# Patient Record
Sex: Female | Born: 1965 | Race: Asian | Hispanic: No | Marital: Married | Smoking: Never smoker
Health system: Southern US, Community
[De-identification: ages and names within clinical notes are randomized; demographics above are authoritative.]

## PROBLEM LIST (undated history)

## (undated) DIAGNOSIS — E559 Vitamin D deficiency, unspecified: Secondary | ICD-10-CM

## (undated) DIAGNOSIS — E785 Hyperlipidemia, unspecified: Secondary | ICD-10-CM

## (undated) HISTORY — DX: Hyperlipidemia, unspecified: E78.5

## (undated) HISTORY — PX: NECK SURGERY: SHX720

## (undated) HISTORY — DX: Vitamin D deficiency, unspecified: E55.9

---

## 2002-05-08 ENCOUNTER — Encounter: Payer: Self-pay | Admitting: Neurosurgery

## 2002-05-08 ENCOUNTER — Ambulatory Visit (HOSPITAL_COMMUNITY): Admission: RE | Admit: 2002-05-08 | Discharge: 2002-05-08 | Payer: Self-pay | Admitting: Neurosurgery

## 2008-06-09 ENCOUNTER — Ambulatory Visit: Payer: Self-pay | Admitting: Internal Medicine

## 2009-06-22 ENCOUNTER — Ambulatory Visit: Payer: Self-pay | Admitting: Internal Medicine

## 2009-09-08 ENCOUNTER — Ambulatory Visit: Payer: Self-pay | Admitting: Family Medicine

## 2010-07-06 ENCOUNTER — Ambulatory Visit: Payer: Self-pay | Admitting: Internal Medicine

## 2012-06-19 ENCOUNTER — Ambulatory Visit: Payer: Self-pay | Admitting: Internal Medicine

## 2014-03-05 ENCOUNTER — Ambulatory Visit: Payer: Self-pay | Admitting: Gastroenterology

## 2014-03-08 LAB — PATHOLOGY REPORT

## 2014-08-05 ENCOUNTER — Ambulatory Visit: Payer: Self-pay | Admitting: Physician Assistant

## 2014-09-03 ENCOUNTER — Ambulatory Visit: Payer: Self-pay | Admitting: Gastroenterology

## 2015-07-11 ENCOUNTER — Other Ambulatory Visit: Payer: Self-pay | Admitting: Physician Assistant

## 2015-07-11 DIAGNOSIS — E2839 Other primary ovarian failure: Secondary | ICD-10-CM

## 2015-07-11 DIAGNOSIS — Z1231 Encounter for screening mammogram for malignant neoplasm of breast: Secondary | ICD-10-CM

## 2015-08-08 ENCOUNTER — Ambulatory Visit
Admission: RE | Admit: 2015-08-08 | Discharge: 2015-08-08 | Disposition: A | Discharge status: Home / Self Care | Payer: 59 | Source: Ambulatory Visit | Attending: Physician Assistant | Admitting: Physician Assistant

## 2015-08-08 ENCOUNTER — Other Ambulatory Visit: Payer: Self-pay

## 2015-08-08 DIAGNOSIS — Z1231 Encounter for screening mammogram for malignant neoplasm of breast: Secondary | ICD-10-CM

## 2016-07-12 ENCOUNTER — Other Ambulatory Visit: Payer: Self-pay | Admitting: Physician Assistant

## 2016-07-12 DIAGNOSIS — Z1231 Encounter for screening mammogram for malignant neoplasm of breast: Secondary | ICD-10-CM

## 2016-07-16 ENCOUNTER — Other Ambulatory Visit: Payer: Self-pay | Admitting: Physician Assistant

## 2016-07-16 DIAGNOSIS — R1310 Dysphagia, unspecified: Secondary | ICD-10-CM

## 2016-07-31 ENCOUNTER — Other Ambulatory Visit: Payer: 59

## 2016-07-31 ENCOUNTER — Ambulatory Visit: Payer: 59

## 2016-08-10 ENCOUNTER — Ambulatory Visit
Admission: RE | Admit: 2016-08-10 | Discharge: 2016-08-10 | Disposition: A | Discharge status: Home / Self Care | Payer: 59 | Source: Ambulatory Visit | Attending: Physician Assistant | Admitting: Physician Assistant

## 2016-08-10 ENCOUNTER — Other Ambulatory Visit: Payer: Self-pay | Admitting: Physician Assistant

## 2016-08-10 DIAGNOSIS — Z1231 Encounter for screening mammogram for malignant neoplasm of breast: Secondary | ICD-10-CM

## 2016-12-06 ENCOUNTER — Ambulatory Visit: Payer: 59 | Admitting: Podiatry

## 2017-08-01 ENCOUNTER — Other Ambulatory Visit: Payer: Self-pay | Admitting: Nurse Practitioner

## 2017-08-01 DIAGNOSIS — Z1231 Encounter for screening mammogram for malignant neoplasm of breast: Secondary | ICD-10-CM

## 2017-08-23 ENCOUNTER — Ambulatory Visit
Admission: RE | Admit: 2017-08-23 | Discharge: 2017-08-23 | Disposition: A | Discharge status: Home / Self Care | Payer: 59 | Source: Ambulatory Visit | Attending: Nurse Practitioner | Admitting: Nurse Practitioner

## 2017-08-23 DIAGNOSIS — Z1231 Encounter for screening mammogram for malignant neoplasm of breast: Secondary | ICD-10-CM | POA: Diagnosis not present

## 2017-12-24 ENCOUNTER — Other Ambulatory Visit: Payer: Self-pay | Admitting: Family Medicine

## 2018-01-03 LAB — VITAMIN D 25 HYDROXY (VIT D DEFICIENCY, FRACTURES): Vit D, 25-Hydroxy: 17.5 ng/mL — ABNORMAL LOW (ref 30.0–100.0)

## 2018-01-03 LAB — COMPREHENSIVE METABOLIC PANEL
ALBUMIN: 4.4 g/dL (ref 3.5–5.5)
ALK PHOS: 70 IU/L (ref 39–117)
ALT: 18 IU/L (ref 0–32)
AST: 16 IU/L (ref 0–40)
Albumin/Globulin Ratio: 1.5 (ref 1.2–2.2)
BUN/Creatinine Ratio: 17 (ref 9–23)
BUN: 12 mg/dL (ref 6–24)
Bilirubin Total: 0.6 mg/dL (ref 0.0–1.2)
CO2: 20 mmol/L (ref 20–29)
CREATININE: 0.7 mg/dL (ref 0.57–1.00)
Calcium: 9.5 mg/dL (ref 8.7–10.2)
Chloride: 104 mmol/L (ref 96–106)
GFR calc Af Amer: 114 mL/min/{1.73_m2} (ref >59)
GFR, EST NON AFRICAN AMERICAN: 99 mL/min/{1.73_m2} (ref >59)
GLUCOSE: 89 mg/dL (ref 65–99)
Globulin, Total: 3 g/dL (ref 1.5–4.5)
Potassium: 4.2 mmol/L (ref 3.5–5.2)
Sodium: 139 mmol/L (ref 134–144)
Total Protein: 7.4 g/dL (ref 6.0–8.5)

## 2018-01-03 LAB — CBC WITH DIFFERENTIAL/PLATELET
BASOS ABS: 0 10*3/uL (ref 0.0–0.2)
Basos: 0 %
EOS (ABSOLUTE): 0.1 10*3/uL (ref 0.0–0.4)
EOS: 3 %
HEMATOCRIT: 37.7 % (ref 34.0–46.6)
HEMOGLOBIN: 12.6 g/dL (ref 11.1–15.9)
IMMATURE GRANULOCYTES: 0 %
Immature Grans (Abs): 0 10*3/uL (ref 0.0–0.1)
LYMPHS ABS: 1.6 10*3/uL (ref 0.7–3.1)
Lymphs: 40 %
MCH: 28.8 pg (ref 26.6–33.0)
MCHC: 33.4 g/dL (ref 31.5–35.7)
MCV: 86 fL (ref 79–97)
MONOCYTES: 9 %
Monocytes Absolute: 0.3 10*3/uL (ref 0.1–0.9)
NEUTROS PCT: 48 %
Neutrophils Absolute: 2 10*3/uL (ref 1.4–7.0)
Platelets: 244 10*3/uL (ref 150–379)
RBC: 4.38 x10E6/uL (ref 3.77–5.28)
RDW: 13.3 % (ref 12.3–15.4)
WBC: 4 10*3/uL (ref 3.4–10.8)

## 2018-01-03 LAB — LIPID PANEL WITH LDL/HDL RATIO
CHOLESTEROL TOTAL: 194 mg/dL (ref 100–199)
HDL: 55 mg/dL (ref >39)
LDL CALC: 107 mg/dL — AB (ref 0–99)
LDL/HDL RATIO: 1.9 ratio (ref 0.0–3.2)
Triglycerides: 160 mg/dL — ABNORMAL HIGH (ref 0–149)
VLDL CHOLESTEROL CAL: 32 mg/dL (ref 5–40)

## 2018-01-03 LAB — B12 AND FOLATE PANEL
Folate: >20 ng/mL (ref >3.0)
Vitamin B-12: 815 pg/mL (ref 232–1245)

## 2018-01-03 LAB — T4, FREE: Free T4: 1.03 ng/dL (ref 0.82–1.77)

## 2018-01-03 LAB — TSH: TSH: 2.37 u[IU]/mL (ref 0.450–4.500)

## 2018-01-14 ENCOUNTER — Telehealth: Payer: Self-pay

## 2018-01-14 ENCOUNTER — Telehealth: Payer: Self-pay | Admitting: Internal Medicine

## 2018-01-14 NOTE — Telephone Encounter (Signed)
Pt advised for labs is ok and vitamin d is low take otc as per dr Welton Flakeskhan

## 2018-01-14 NOTE — Telephone Encounter (Signed)
-----   [facility] sent at 01/14/2018 10:13 AM EST ----- PT WOULD LIKE HER LAB RESULTS PER PT HAS LEFT MANY MESSAGES TO GET RESULTS ( I DO NOT SEE ANY PREVIOUS MESSAGE TAKEN, ) PER PT NEEDS RESULTS OR CALLBACK OR SHE WILL SWITCH PCP

## 2018-01-14 NOTE — Telephone Encounter (Signed)
SPOKE WITH PT REGARDING LAB RESULTS AND SENT PROVIDER MESSAGE FOR LAB RESULTS TO ADVISE PT, AWAITING ON PROVIDER RESPONSE/BR

## 2018-03-30 ENCOUNTER — Emergency Department: Payer: Managed Care, Other (non HMO)

## 2018-03-30 ENCOUNTER — Other Ambulatory Visit: Payer: Self-pay

## 2018-03-30 ENCOUNTER — Encounter: Payer: Self-pay | Admitting: Emergency Medicine

## 2018-03-30 ENCOUNTER — Emergency Department
Admission: EM | Admit: 2018-03-30 | Discharge: 2018-03-30 | Disposition: A | Discharge status: Home / Self Care | Payer: Managed Care, Other (non HMO) | Attending: Student in an Organized Health Care Education/Training Program | Admitting: Student in an Organized Health Care Education/Training Program

## 2018-03-30 DIAGNOSIS — R1031 Right lower quadrant pain: Secondary | ICD-10-CM | POA: Diagnosis present

## 2018-03-30 DIAGNOSIS — R109 Unspecified abdominal pain: Secondary | ICD-10-CM

## 2018-03-30 LAB — BASIC METABOLIC PANEL
ANION GAP: 7 (ref 5–15)
BUN: 15 mg/dL (ref 6–20)
CALCIUM: 9.1 mg/dL (ref 8.9–10.3)
CO2: 24 mmol/L (ref 22–32)
Chloride: 107 mmol/L (ref 101–111)
Creatinine, Ser: 0.83 mg/dL (ref 0.44–1.00)
GLUCOSE: 147 mg/dL — AB (ref 65–99)
POTASSIUM: 4.1 mmol/L (ref 3.5–5.1)
Sodium: 138 mmol/L (ref 135–145)

## 2018-03-30 LAB — CBC
HEMATOCRIT: 40.3 % (ref 35.0–47.0)
HEMOGLOBIN: 13.4 g/dL (ref 12.0–16.0)
MCH: 28.1 pg (ref 26.0–34.0)
MCHC: 33.3 g/dL (ref 32.0–36.0)
MCV: 84.5 fL (ref 80.0–100.0)
Platelets: 253 10*3/uL (ref 150–440)
RBC: 4.78 MIL/uL (ref 3.80–5.20)
RDW: 13.4 % (ref 11.5–14.5)
WBC: 11.2 10*3/uL — ABNORMAL HIGH (ref 3.6–11.0)

## 2018-03-30 LAB — URINALYSIS, ROUTINE W REFLEX MICROSCOPIC
BACTERIA UA: NONE SEEN
BILIRUBIN URINE: NEGATIVE
Glucose, UA: NEGATIVE mg/dL
KETONES UR: 20 mg/dL — AB
Leukocytes, UA: NEGATIVE
Nitrite: NEGATIVE
PROTEIN: NEGATIVE mg/dL
Specific Gravity, Urine: 1.018 (ref 1.005–1.030)
pH: 8 (ref 5.0–8.0)

## 2018-03-30 MED ORDER — SODIUM CHLORIDE 0.9 % IV BOLUS
1000.0000 mL | Freq: Once | INTRAVENOUS | Status: AC
  Administered 2018-03-30: 1000 mL via INTRAVENOUS

## 2018-03-30 MED ORDER — CEPHALEXIN 500 MG PO CAPS
500.0000 mg | ORAL_CAPSULE | Freq: Once | ORAL | Status: AC
  Administered 2018-03-30: 500 mg via ORAL
  Filled 2018-03-30: qty 1

## 2018-03-30 MED ORDER — KETOROLAC TROMETHAMINE 30 MG/ML IJ SOLN
15.0000 mg | Freq: Once | INTRAMUSCULAR | Status: AC
  Administered 2018-03-30: 15 mg via INTRAVENOUS
  Filled 2018-03-30: qty 1

## 2018-03-30 MED ORDER — FENTANYL CITRATE (PF) 100 MCG/2ML IJ SOLN
50.0000 ug | Freq: Once | INTRAMUSCULAR | Status: AC
  Administered 2018-03-30: 50 ug via INTRAVENOUS
  Filled 2018-03-30: qty 2

## 2018-03-30 MED ORDER — IOPAMIDOL (ISOVUE-370) INJECTION 76%
75.0000 mL | Freq: Once | INTRAVENOUS | Status: AC | PRN
  Administered 2018-03-30: 75 mL via INTRAVENOUS

## 2018-03-30 MED ORDER — FENTANYL CITRATE (PF) 100 MCG/2ML IJ SOLN
100.0000 ug | INTRAMUSCULAR | Status: DC | PRN
  Filled 2018-03-30: qty 2

## 2018-03-30 MED ORDER — CEPHALEXIN 500 MG PO CAPS: 500.0000 mg | ORAL_CAPSULE | Freq: Three times a day (TID) | ORAL | 0 refills | Status: AC

## 2018-03-30 NOTE — ED Provider Notes (Signed)
[facility] Emergency Department Provider Note    First MD Initiated Contact with Patient 03/30/18 1032     (approximate)  I have reviewed the triage vital signs and the nursing notes.   HISTORY  Chief Complaint Flank Pain and Back Pain (right lower )    HPI Linda Silva is a 52 y.o. female who presents with moderate to severe rlq and right flank pain with dysuria that started this am.  Patient states that she has a remote history of kidney stone.  No previous abdominal surgeries.  No measured fevers.  Was seen in urgent care today and had urine dipstick that had suggestive of urinary tract infection she was sent to the ER for further evaluation of UTI and possible kidney infection.  History reviewed. No pertinent past medical history. Family History  Problem Relation Age of Onset  . Breast cancer Neg Hx    Past Surgical History:  Procedure Laterality Date  . NECK SURGERY     There are no active problems to display for this patient.     Prior to Admission medications   Not on File    Allergies Patient has no known allergies.    Social History Social History   Tobacco Use  . Smoking status: Never Smoker  Substance Use Topics  . Alcohol use: Never    Frequency: Never  . Drug use: Never    Review of Systems Patient denies headaches, rhinorrhea, blurry vision, numbness, shortness of breath, chest pain, edema, cough, abdominal pain, nausea, vomiting, diarrhea, dysuria, fevers, rashes or hallucinations unless otherwise stated above in HPI. ____________________________________________   PHYSICAL EXAM:  VITAL SIGNS: Vitals:   03/30/18 0935  BP: (!) 169/87  Pulse: 62  Resp: 16  Temp: (!) 97.5 F (36.4 C)  SpO2: 97%    Constitutional: Alert and oriented. Well appearing and in no acute distress. Eyes: Conjunctivae are normal.  Head: Atraumatic. Nose: No congestion/rhinnorhea. Mouth/Throat: Mucous membranes are moist.     Neck: No stridor. Painless ROM.  Cardiovascular: Normal rate, regular rhythm. Grossly normal heart sounds.  Good peripheral circulation. Respiratory: Normal respiratory effort.  No retractions. Lungs CTAB. Gastrointestinal: Soft with RLQ, ttp. No distention. No abdominal bruits. + right CVA tenderness. Genitourinary:  Musculoskeletal: No lower extremity tenderness nor edema.  No joint effusions. Neurologic:  Normal speech and language. No gross focal neurologic deficits are appreciated. No facial droop Skin:  Skin is warm, dry and intact. No rash noted. Psychiatric: Mood and affect are normal. Speech and behavior are normal.  ____________________________________________   LABS (all labs ordered are listed, but only abnormal results are displayed)  Results for orders placed or performed during the hospital encounter of 03/30/18 (from the past 24 hour(s))  Basic metabolic panel     Status: Abnormal   Collection Time: 03/30/18  9:50 AM  Result Value Ref Range   Sodium 138 135 - 145 mmol/L   Potassium 4.1 3.5 - 5.1 mmol/L   Chloride 107 101 - 111 mmol/L   CO2 24 22 - 32 mmol/L   Glucose, Bld 147 (H) 65 - 99 mg/dL   BUN 15 6 - 20 mg/dL   Creatinine, Ser 1.61 0.44 - 1.00 mg/dL   Calcium 9.1 8.9 - 09.6 mg/dL   GFR calc non Af Amer >60 >60 mL/min   GFR calc Af Amer >60 >60 mL/min   Anion gap 7 5 - 15  CBC     Status: Abnormal   Collection Time:  03/30/18  9:50 AM  Result Value Ref Range   WBC 11.2 (H) 3.6 - 11.0 K/uL   RBC 4.78 3.80 - 5.20 MIL/uL   Hemoglobin 13.4 12.0 - 16.0 g/dL   HCT 16.1 09.6 - 04.5 %   MCV 84.5 80.0 - 100.0 fL   MCH 28.1 26.0 - 34.0 pg   MCHC 33.3 32.0 - 36.0 g/dL   RDW 40.9 81.1 - 91.4 %   Platelets 253 150 - 440 K/uL   ____________________________________________ ____________________________________________  RADIOLOGY  I personally reviewed all radiographic images ordered to evaluate for the above acute complaints and reviewed radiology reports and  findings.  These findings were personally discussed with the patient.  Please see medical record for radiology report.  ____________________________________________   PROCEDURES  Procedure(s) performed:  Procedures    Critical Care performed: no ____________________________________________   INITIAL IMPRESSION / ASSESSMENT AND PLAN / ED COURSE  Pertinent labs & imaging results that were available during my care of the patient were reviewed by me and considered in my medical decision making (see chart for details).  DDX: Bursitis, Pilo, UTI, stone, colitis, muscular skeletal pain, shingles  Linda Silva is a 52 y.o. who presents to the ED with symptoms as described above.  Patient with mild right lower quadrant pain.  Blood work sent for the above differential does show evidence of leukocytosis.  No evidence of UTI.  Possible stone but based on her tenderness will order CT imaging to evaluate for acute appendicitis.  Clinical Course as of Mar 30 1406  Sun Mar 30, 2018  1355 She reassessed.  States that she is feeling much improved.  CT imaging shows no evidence of acute appendicitis.  Probable recently passed kidney stone.  Her urine here shows no evidence of bacteria or WBCs but her urine at urgent care did show leukocytes.  Based on her pain and differential including ascending urinary tract infection we will go ahead empirically treat and send urine for culture.  Patient was able to tolerate PO and was able to ambulate with a steady gait.  Have discussed with the patient and available family all diagnostics and treatments performed thus far and all questions were answered to the best of my ability. The patient demonstrates understanding and agreement with plan.    [PR]    Clinical Course User Index [PR] Willy Eddy, MD     As part of my medical decision making, I reviewed the following data within the electronic MEDICAL RECORD NUMBER Nursing notes reviewed and incorporated,  Labs reviewed, notes from prior ED visits.   ____________________________________________   FINAL CLINICAL IMPRESSION(S) / ED DIAGNOSES  Final diagnoses:  Right flank pain      NEW MEDICATIONS STARTED DURING THIS VISIT:  New Prescriptions   No medications on file     Note:  This document was prepared using Dragon voice recognition software and may include unintentional dictation errors.    Willy Eddy, MD 03/30/18 [phone]

## 2018-03-30 NOTE — ED Notes (Signed)
Patient transported to CT 

## 2018-03-30 NOTE — Discharge Instructions (Addendum)

## 2018-03-30 NOTE — ED Triage Notes (Addendum)
FIRST NURSE NOTE-sent from urgent care for possible kidney infection. No labs done at urgent care, urine dip report sent.  NAD at this time.  Alert. Pulled next for triage to evaluate.

## 2018-03-30 NOTE — ED Triage Notes (Signed)
Pt reports woke up this morning with severe right lower back pain, flank pain reports burning sensation with urination and pressure. Reports took a Norco at home did not help with pain.

## 2018-07-07 ENCOUNTER — Ambulatory Visit: Payer: Managed Care, Other (non HMO) | Admitting: Nurse Practitioner

## 2018-07-07 ENCOUNTER — Encounter: Payer: Self-pay | Admitting: Nurse Practitioner

## 2018-07-07 VITALS — BP 129/78 | HR 79 | Resp 16 | Ht 62.0 in | Wt 141.6 lb

## 2018-07-07 DIAGNOSIS — Z124 Encounter for screening for malignant neoplasm of cervix: Secondary | ICD-10-CM

## 2018-07-07 DIAGNOSIS — E782 Mixed hyperlipidemia: Secondary | ICD-10-CM

## 2018-07-07 DIAGNOSIS — E559 Vitamin D deficiency, unspecified: Secondary | ICD-10-CM

## 2018-07-07 DIAGNOSIS — M25561 Pain in right knee: Secondary | ICD-10-CM

## 2018-07-07 DIAGNOSIS — R300 Dysuria: Secondary | ICD-10-CM

## 2018-07-07 DIAGNOSIS — G8929 Other chronic pain: Secondary | ICD-10-CM

## 2018-07-07 DIAGNOSIS — R079 Chest pain, unspecified: Secondary | ICD-10-CM | POA: Diagnosis not present

## 2018-07-07 DIAGNOSIS — Z0001 Encounter for general adult medical examination with abnormal findings: Secondary | ICD-10-CM | POA: Diagnosis not present

## 2018-07-07 DIAGNOSIS — M25571 Pain in right ankle and joints of right foot: Secondary | ICD-10-CM

## 2018-07-07 MED ORDER — ROSUVASTATIN CALCIUM 5 MG PO TABS: 5.0000 mg | ORAL_TABLET | Freq: Every day | ORAL | 3 refills | Status: DC

## 2018-07-07 MED ORDER — MELOXICAM 7.5 MG PO TABS: 7.5000 mg | ORAL_TABLET | Freq: Two times a day (BID) | ORAL | 0 refills | Status: DC | PRN

## 2018-07-07 MED ORDER — ERGOCALCIFEROL 1.25 MG (50000 UT) PO CAPS: 50000.0000 [IU] | ORAL_CAPSULE | ORAL | 5 refills | Status: DC

## 2018-07-07 MED ORDER — ROSUVASTATIN CALCIUM 5 MG PO TABS: 5.0000 mg | ORAL_TABLET | Freq: Every day | ORAL | 4 refills | Status: DC

## 2018-07-07 NOTE — Progress Notes (Signed)
[facility] [address] [place], [place] [address]  Internal MEDICINE  Office Visit Note  Patient Name: Linda Silva  [id number] 01, 2067  [id number]  Date of Service: 07/20/2018   Pt is here for routine health maintenance examination  Chief Complaint  Patient presents with  . Annual Exam  . Gynecologic Exam  . Pain    knee and ankle pain been going on for some years but has gotten worse lately  . Labs Only    pt would like to know results for labs from May      The patient is c/o pain in right ankle and right knee. Has been going on for some time . Pain is worse when she is standing or walking for long periods of time. There is some welling on both the knee and the ankle. No specific injury to report.     Current Medication: Outpatient Encounter Medications as of 07/07/2018  Medication Sig  . ergocalciferol (DRISDOL) 50000 units capsule Take 1 capsule (50,000 Units total) by mouth once a week.  . meloxicam (MOBIC) 7.5 MG tablet Take 1 tablet (7.5 mg total) by mouth 2 (two) times daily as needed for pain.  . rosuvastatin (CRESTOR) 5 MG tablet Take 1 tablet (5 mg total) by mouth daily.  . [DISCONTINUED] rosuvastatin (CRESTOR) 5 MG tablet Take 1 tablet (5 mg total) by mouth daily.  . [DISCONTINUED] rosuvastatin (CRESTOR) 5 MG tablet Take 1 tablet (5 mg total) by mouth daily.   No facility-administered encounter medications on file as of 07/07/2018.     Surgical History: Past Surgical History:  Procedure Laterality Date  . NECK SURGERY      Medical History: History reviewed. No pertinent past medical history.  Family History: Family History  Problem Relation Age of Onset  . Breast cancer Neg Hx       Review of Systems  Constitutional: Negative for activity change, chills, fatigue and unexpected weight change.  HENT: Negative for congestion, postnasal drip, rhinorrhea, sneezing and sore throat.   Eyes: Negative.  Negative for redness.  Respiratory:  Negative for cough, chest tightness, shortness of breath and wheezing.   Cardiovascular: Negative for chest pain and palpitations.  Gastrointestinal: Negative for abdominal pain, constipation, diarrhea, nausea and vomiting.  Endocrine: Negative for cold intolerance, heat intolerance, polydipsia, polyphagia and polyuria.  Genitourinary: Negative.  Negative for dysuria and frequency.  Musculoskeletal: Positive for arthralgias and myalgias. Negative for back pain, joint swelling and neck pain.       Tenderness and mild swelling of right knee and right ankle.   Skin: Negative for rash.  Allergic/Immunologic: Negative for environmental allergies.  Neurological: Negative for dizziness, tremors, numbness and headaches.  Hematological: Negative for adenopathy. Does not bruise/bleed easily.  Psychiatric/Behavioral: Negative for behavioral problems (Depression), sleep disturbance and suicidal ideas. The patient is not nervous/anxious.     Today's Vitals   07/07/18 1510  BP: 129/78  Pulse: 79  Resp: 16  SpO2: 99%  Weight: 141 lb 9.6 oz (64.2 kg)  Height: 5\' 2"  (1.575 m)   Physical Exam  Constitutional: She is oriented to person, place, and time. She appears well-developed and well-nourished. No distress.  HENT:  Head: Normocephalic and atraumatic.  Nose: Nose normal.  Mouth/Throat: Oropharynx is clear and moist. No oropharyngeal exudate.  Eyes: Pupils are equal, round, and reactive to light. Conjunctivae and EOM are normal.  Neck: Normal range of motion. Neck supple. No JVD present. Carotid bruit is not present. No tracheal deviation  present. No thyromegaly present.  Cardiovascular: Normal rate, regular rhythm, normal heart sounds and intact distal pulses. Exam reveals no gallop and no friction rub.  No murmur heard. Pulmonary/Chest: Effort normal and breath sounds normal. No respiratory distress. She has no wheezes. She has no rales. She exhibits no tenderness. Right breast exhibits no  inverted nipple, no mass, no nipple discharge, no skin change and no tenderness. Left breast exhibits no inverted nipple, no mass, no nipple discharge, no skin change and no tenderness.  Abdominal: Soft. Bowel sounds are normal.  Genitourinary: Vagina normal and uterus normal.  Genitourinary Comments: No tenderness, masses, or organomeglay present during bimanual exam .  Musculoskeletal: Normal range of motion.  Mild swelling and tenderness of right knee. Crepitus can be felt with flexion of the knee. ROM and strength are intact.  Tenderness with mild swelling along the lateral aspect of right ankle. Hurts more with internal and external rotation of the foot. Distal pulse is intact.   Lymphadenopathy:    She has no cervical adenopathy.  Neurological: She is alert and oriented to person, place, and time. No cranial nerve deficit.  Skin: Skin is warm and dry. Capillary refill takes less than 2 seconds. She is not diaphoretic.  Psychiatric: She has a normal mood and affect. Her behavior is normal. Judgment and thought content normal.  Nursing note and vitals reviewed.   Assessment/Plan: 1. Encounter for general adult medical examination with abnormal findings Annual health maintenance exam today.  2. Chest pain, unspecified type Will get echo for further evaluation.  - ECHOCARDIOGRAM COMPLETE; Future  3. Chronic pain of right ankle Start meloxicam 7.5mg  twice daily to reduce pain and inflammation. Rest, ice, and elevate the ankle when possible. Will get x-ray for further evaluation.   - DG Ankle Complete Right; Future - meloxicam (MOBIC) 7.5 MG tablet; Take 1 tablet (7.5 mg total) by mouth 2 (two) times daily as needed for pain.  Dispense: 30 tablet; Refill: 0  4. Chronic pain of right knee Start meloxicam 7.5mg  twice daily to reduce pain and inflammation. Rest, ice, and elevate the knee when possible. Will get x-ray for further evaluation.   - DG Knee Complete 4 Views Right; Future -  meloxicam (MOBIC) 7.5 MG tablet; Take 1 tablet (7.5 mg total) by mouth 2 (two) times daily as needed for pain.  Dispense: 30 tablet; Refill: 0  5. Mixed hyperlipidemia - rosuvastatin (CRESTOR) 5 MG tablet; Take 1 tablet (5 mg total) by mouth daily.  Dispense: 90 tablet; Refill: 4  6. Vitamin D deficiency - ergocalciferol (DRISDOL) 50000 units capsule; Take 1 capsule (50,000 Units total) by mouth once a week.  Dispense: 4 capsule; Refill: 5  7. Screening for malignant neoplasm of cervix - Pap IG and HPV (high risk) DNA detection    General Counseling: Linda Silva verbalizes understanding of the findings of todays visit and agrees with plan of treatment. I have discussed any further diagnostic evaluation that may be needed or ordered today. We also reviewed her medications today. she has been encouraged to call the office with any questions or concerns that should arise related to todays visit.    Counseling:  Apply a compressive ACE bandage. Rest and elevate the affected painful area.  Apply cold compresses intermittently as needed.  As pain recedes, begin normal activities slowly as tolerated.  Call if symptoms persist.  This patient was seen by Vincent GrosHeather Tabby Beaston FNP Collaboration with Dr Lyndon CodeFozia M Khan as a part of collaborative care agreement  Orders Placed This Encounter  Procedures  . Microscopic Examination  . DG Knee Complete 4 Views Right  . DG Ankle Complete Right  . UA/M w/rflx Culture, Routine  . ECHOCARDIOGRAM COMPLETE    Meds ordered this encounter  Medications  . DISCONTD: rosuvastatin (CRESTOR) 5 MG tablet    Sig: Take 1 tablet (5 mg total) by mouth daily.    Dispense:  90 tablet    Refill:  3    Order Specific Question:   Supervising Provider    Answer:   Lyndon Code [[id number]]  . DISCONTD: rosuvastatin (CRESTOR) 5 MG tablet    Sig: Take 1 tablet (5 mg total) by mouth daily.    Dispense:  90 tablet    Refill:  4    Order Specific Question:   Supervising Provider     Answer:   Lyndon Code [[id number]]  . ergocalciferol (DRISDOL) 50000 units capsule    Sig: Take 1 capsule (50,000 Units total) by mouth once a week.    Dispense:  4 capsule    Refill:  5    Order Specific Question:   Supervising Provider    Answer:   Lyndon Code [[id number]]  . rosuvastatin (CRESTOR) 5 MG tablet    Sig: Take 1 tablet (5 mg total) by mouth daily.    Dispense:  90 tablet    Refill:  4    Order Specific Question:   Supervising Provider    Answer:   Lyndon Code [[id number]]  . meloxicam (MOBIC) 7.5 MG tablet    Sig: Take 1 tablet (7.5 mg total) by mouth 2 (two) times daily as needed for pain.    Dispense:  30 tablet    Refill:  0    Order Specific Question:   Supervising Provider    Answer:   Lyndon Code [[id number]]    Time spent: 39 Minutes      Lyndon Code, MD  Internal Medicine

## 2018-07-08 LAB — MICROSCOPIC EXAMINATION
Bacteria, UA: NONE SEEN
Casts: NONE SEEN /lpf

## 2018-07-08 LAB — UA/M W/RFLX CULTURE, ROUTINE
Bilirubin, UA: NEGATIVE
Glucose, UA: NEGATIVE
Ketones, UA: NEGATIVE
LEUKOCYTES UA: NEGATIVE
Nitrite, UA: NEGATIVE
PH UA: 6 (ref 5.0–7.5)
PROTEIN UA: NEGATIVE
RBC, UA: NEGATIVE
SPEC GRAV UA: 1.005 (ref 1.005–1.030)
Urobilinogen, Ur: 0.2 mg/dL (ref 0.2–1.0)

## 2018-07-10 LAB — PAP IG AND HPV HIGH-RISK
HPV, HIGH-RISK: NEGATIVE
PAP Smear Comment: 0

## 2018-07-13 NOTE — Progress Notes (Signed)
Please let the patient know that er pap smear was normal. Thanks

## 2018-07-14 ENCOUNTER — Telehealth: Payer: Self-pay

## 2018-07-14 NOTE — Telephone Encounter (Signed)
PT WAS NOTIFIED. 

## 2018-07-14 NOTE — Telephone Encounter (Signed)
-----   Message from Carlean JewsHeather E Boscia, NP sent at 07/13/2018  4:42 PM EDT ----- Please let the patient know that er pap smear was normal. Thanks

## 2018-07-18 ENCOUNTER — Ambulatory Visit: Payer: Managed Care, Other (non HMO)

## 2018-07-18 ENCOUNTER — Other Ambulatory Visit: Payer: Self-pay

## 2018-07-18 DIAGNOSIS — R079 Chest pain, unspecified: Secondary | ICD-10-CM | POA: Diagnosis not present

## 2018-07-20 DIAGNOSIS — Z0001 Encounter for general adult medical examination with abnormal findings: Principal | ICD-10-CM

## 2018-07-20 DIAGNOSIS — G8929 Other chronic pain: Secondary | ICD-10-CM | POA: Insufficient documentation

## 2018-07-20 DIAGNOSIS — M25571 Pain in right ankle and joints of right foot: Secondary | ICD-10-CM

## 2018-07-20 DIAGNOSIS — M25561 Pain in right knee: Secondary | ICD-10-CM

## 2018-07-20 DIAGNOSIS — Z124 Encounter for screening for malignant neoplasm of cervix: Secondary | ICD-10-CM | POA: Insufficient documentation

## 2018-07-20 DIAGNOSIS — R079 Chest pain, unspecified: Secondary | ICD-10-CM | POA: Insufficient documentation

## 2018-07-20 DIAGNOSIS — Z1239 Encounter for other screening for malignant neoplasm of breast: Secondary | ICD-10-CM | POA: Insufficient documentation

## 2018-07-20 DIAGNOSIS — E782 Mixed hyperlipidemia: Secondary | ICD-10-CM | POA: Insufficient documentation

## 2018-07-20 DIAGNOSIS — R300 Dysuria: Secondary | ICD-10-CM | POA: Insufficient documentation

## 2018-07-20 DIAGNOSIS — E559 Vitamin D deficiency, unspecified: Secondary | ICD-10-CM | POA: Insufficient documentation

## 2018-07-30 ENCOUNTER — Telehealth: Payer: Self-pay

## 2018-07-30 NOTE — Telephone Encounter (Signed)
Pt advised echo normal and copy of result for pickup

## 2018-07-30 NOTE — Telephone Encounter (Signed)
Overall her echo looks good.

## 2018-11-07 ENCOUNTER — Ambulatory Visit: Payer: Self-pay | Admitting: Nurse Practitioner

## 2018-12-23 ENCOUNTER — Other Ambulatory Visit: Payer: Self-pay | Admitting: Nurse Practitioner

## 2018-12-24 LAB — COMPREHENSIVE METABOLIC PANEL
A/G RATIO: 2 (ref 1.2–2.2)
ALT: 23 IU/L (ref 0–32)
AST: 22 IU/L (ref 0–40)
Albumin: 4.7 g/dL (ref 3.8–4.9)
Alkaline Phosphatase: 65 IU/L (ref 39–117)
BUN/Creatinine Ratio: 16 (ref 9–23)
BUN: 12 mg/dL (ref 6–24)
Bilirubin Total: 0.7 mg/dL (ref 0.0–1.2)
CALCIUM: 9.5 mg/dL (ref 8.7–10.2)
CHLORIDE: 102 mmol/L (ref 96–106)
CO2: 22 mmol/L (ref 20–29)
Creatinine, Ser: 0.77 mg/dL (ref 0.57–1.00)
GFR calc Af Amer: 101 mL/min/{1.73_m2} (ref >59)
GFR, EST NON AFRICAN AMERICAN: 88 mL/min/{1.73_m2} (ref >59)
Globulin, Total: 2.4 g/dL (ref 1.5–4.5)
Glucose: 85 mg/dL (ref 65–99)
POTASSIUM: 4 mmol/L (ref 3.5–5.2)
Sodium: 139 mmol/L (ref 134–144)
Total Protein: 7.1 g/dL (ref 6.0–8.5)

## 2018-12-24 LAB — CBC
HEMOGLOBIN: 13 g/dL (ref 11.1–15.9)
Hematocrit: 38.9 % (ref 34.0–46.6)
MCH: 28.7 pg (ref 26.6–33.0)
MCHC: 33.4 g/dL (ref 31.5–35.7)
MCV: 86 fL (ref 79–97)
Platelets: 243 10*3/uL (ref 150–450)
RBC: 4.53 x10E6/uL (ref 3.77–5.28)
RDW: 12.9 % (ref 11.7–15.4)
WBC: 4.8 10*3/uL (ref 3.4–10.8)

## 2018-12-24 LAB — B12 AND FOLATE PANEL: VITAMIN B 12: 811 pg/mL (ref 232–1245)

## 2018-12-24 LAB — LIPID PANEL W/O CHOL/HDL RATIO
Cholesterol, Total: 179 mg/dL (ref 100–199)
HDL: 56 mg/dL (ref >39)
LDL Calculated: 89 mg/dL (ref 0–99)
TRIGLYCERIDES: 170 mg/dL — AB (ref 0–149)
VLDL CHOLESTEROL CAL: 34 mg/dL (ref 5–40)

## 2018-12-24 LAB — IRON AND TIBC
Iron Saturation: 26 % (ref 15–55)
Iron: 104 ug/dL (ref 27–159)
Total Iron Binding Capacity: 401 ug/dL (ref 250–450)
UIBC: 297 ug/dL (ref 131–425)

## 2018-12-24 LAB — TSH: TSH: 3.04 u[IU]/mL (ref 0.450–4.500)

## 2018-12-24 LAB — VITAMIN D 25 HYDROXY (VIT D DEFICIENCY, FRACTURES): VIT D 25 HYDROXY: 36.6 ng/mL (ref 30.0–100.0)

## 2018-12-24 LAB — FERRITIN: Ferritin: 49 ng/mL (ref 15–150)

## 2018-12-24 LAB — T4, FREE: Free T4: 0.97 ng/dL (ref 0.82–1.77)

## 2019-01-06 ENCOUNTER — Other Ambulatory Visit: Payer: Self-pay

## 2019-01-07 ENCOUNTER — Other Ambulatory Visit: Payer: Self-pay | Admitting: Nurse Practitioner

## 2019-01-07 DIAGNOSIS — E559 Vitamin D deficiency, unspecified: Secondary | ICD-10-CM

## 2019-01-07 MED ORDER — ERGOCALCIFEROL 1.25 MG (50000 UT) PO CAPS: 50000.0000 [IU] | ORAL_CAPSULE | ORAL | 5 refills | Status: DC

## 2019-01-07 NOTE — Progress Notes (Signed)
Vitamin d just above normal levels at check 11/2018. Renew drisdol 50000iu weekly and sent new rx to [facility] per request.

## 2019-05-28 ENCOUNTER — Other Ambulatory Visit: Payer: Self-pay | Admitting: Internal Medicine

## 2019-07-06 ENCOUNTER — Other Ambulatory Visit: Payer: Self-pay | Admitting: Nurse Practitioner

## 2019-07-07 LAB — COMPREHENSIVE METABOLIC PANEL
ALT: 13 IU/L (ref 0–32)
AST: 17 IU/L (ref 0–40)
Albumin/Globulin Ratio: 1.8 (ref 1.2–2.2)
Albumin: 4.4 g/dL (ref 3.8–4.9)
Alkaline Phosphatase: 62 IU/L (ref 39–117)
BUN/Creatinine Ratio: 15 (ref 9–23)
BUN: 11 mg/dL (ref 6–24)
Bilirubin Total: 0.7 mg/dL (ref 0.0–1.2)
CO2: 23 mmol/L (ref 20–29)
Calcium: 9.6 mg/dL (ref 8.7–10.2)
Chloride: 104 mmol/L (ref 96–106)
Creatinine, Ser: 0.73 mg/dL (ref 0.57–1.00)
GFR calc Af Amer: 107 mL/min/{1.73_m2} (ref >59)
GFR calc non Af Amer: 93 mL/min/{1.73_m2} (ref >59)
Globulin, Total: 2.5 g/dL (ref 1.5–4.5)
Glucose: 89 mg/dL (ref 65–99)
Potassium: 4.1 mmol/L (ref 3.5–5.2)
Sodium: 139 mmol/L (ref 134–144)
Total Protein: 6.9 g/dL (ref 6.0–8.5)

## 2019-07-07 LAB — CBC
Hematocrit: 38.2 % (ref 34.0–46.6)
Hemoglobin: 12.3 g/dL (ref 11.1–15.9)
MCH: 27.6 pg (ref 26.6–33.0)
MCHC: 32.2 g/dL (ref 31.5–35.7)
MCV: 86 fL (ref 79–97)
Platelets: 224 10*3/uL (ref 150–450)
RBC: 4.46 x10E6/uL (ref 3.77–5.28)
RDW: 13.1 % (ref 11.7–15.4)
WBC: 4.3 10*3/uL (ref 3.4–10.8)

## 2019-07-07 LAB — LIPID PANEL W/O CHOL/HDL RATIO
Cholesterol, Total: 191 mg/dL (ref 100–199)
HDL: 54 mg/dL (ref >39)
LDL Calculated: 108 mg/dL — ABNORMAL HIGH (ref 0–99)
Triglycerides: 145 mg/dL (ref 0–149)
VLDL Cholesterol Cal: 29 mg/dL (ref 5–40)

## 2019-07-07 LAB — VITAMIN D 25 HYDROXY (VIT D DEFICIENCY, FRACTURES): Vit D, 25-Hydroxy: 36.8 ng/mL (ref 30.0–100.0)

## 2019-07-07 LAB — IRON AND TIBC
Iron Saturation: 27 % (ref 15–55)
Iron: 105 ug/dL (ref 27–159)
Total Iron Binding Capacity: 392 ug/dL (ref 250–450)
UIBC: 287 ug/dL (ref 131–425)

## 2019-07-07 LAB — B12 AND FOLATE PANEL
Folate: >20 ng/mL (ref >3.0)
Vitamin B-12: 721 pg/mL (ref 232–1245)

## 2019-07-07 LAB — TSH: TSH: 1.8 u[IU]/mL (ref 0.450–4.500)

## 2019-07-07 LAB — FERRITIN: Ferritin: 33 ng/mL (ref 15–150)

## 2019-07-07 LAB — T4, FREE: Free T4: 0.88 ng/dL (ref 0.82–1.77)

## 2019-07-08 NOTE — Progress Notes (Signed)
Labs good. Discuss at visit 07/09/2019

## 2019-07-09 ENCOUNTER — Ambulatory Visit (INDEPENDENT_AMBULATORY_CARE_PROVIDER_SITE_OTHER): Payer: Managed Care, Other (non HMO) | Admitting: Nurse Practitioner

## 2019-07-09 ENCOUNTER — Other Ambulatory Visit: Payer: Self-pay

## 2019-07-09 ENCOUNTER — Encounter: Payer: Self-pay | Admitting: Nurse Practitioner

## 2019-07-09 VITALS — BP 119/87 | HR 74 | Temp 97.8°F | Resp 16 | Ht 62.0 in | Wt 140.0 lb

## 2019-07-09 DIAGNOSIS — R300 Dysuria: Secondary | ICD-10-CM

## 2019-07-09 DIAGNOSIS — Z0001 Encounter for general adult medical examination with abnormal findings: Secondary | ICD-10-CM

## 2019-07-09 DIAGNOSIS — Z1239 Encounter for other screening for malignant neoplasm of breast: Secondary | ICD-10-CM

## 2019-07-09 DIAGNOSIS — E782 Mixed hyperlipidemia: Secondary | ICD-10-CM | POA: Diagnosis not present

## 2019-07-09 DIAGNOSIS — G8929 Other chronic pain: Secondary | ICD-10-CM

## 2019-07-09 DIAGNOSIS — M25561 Pain in right knee: Secondary | ICD-10-CM | POA: Diagnosis not present

## 2019-07-09 MED ORDER — ROSUVASTATIN CALCIUM 5 MG PO TABS: 5.0000 mg | ORAL_TABLET | Freq: Every day | ORAL | 4 refills | Status: DC

## 2019-07-09 NOTE — Progress Notes (Signed)
[facility] [address] [place], [place] [id number]  Internal MEDICINE  Office Visit Note  Patient Name: Linda Silva  [id number]  [id number]  Date of Service: 07/09/2019   Pt is here for routine health maintenance examination   Chief Complaint  Patient presents with  . Annual Exam  . Hyperlipidemia  . Vitamin D deficiency  . Quality Metric Gaps    colonoscopy, mammogram  . Medication Refill    crestor and vitamin d to [facility]   . Results    labs     The patient is here for routine health maintenance exam. She has recently had labs done. LDL cholesterol mildly elevated at 108. Rest of lipid panel was within normal limits. All other labs were normal. She state that she has some intermittent right knee and ankle pain. She has seen orthopedics for this. Standing for long periods makes this worse. Taking NSAIDs and resting improves the problem.    Current Medication: Outpatient Encounter Medications as of 07/09/2019  Medication Sig  . ergocalciferol (DRISDOL) 1.25 MG (50000 UT) capsule Take 1 capsule (50,000 Units total) by mouth once a week.  . rosuvastatin (CRESTOR) 5 MG tablet Take 1 tablet (5 mg total) by mouth daily.  . [DISCONTINUED] rosuvastatin (CRESTOR) 5 MG tablet Take 1 tablet (5 mg total) by mouth daily.  . [DISCONTINUED] meloxicam (MOBIC) 7.5 MG tablet Take 1 tablet (7.5 mg total) by mouth 2 (two) times daily as needed for pain. (Patient not taking: Reported on 07/09/2019)   No facility-administered encounter medications on file as of 07/09/2019.     Surgical History: Past Surgical History:  Procedure Laterality Date  . NECK SURGERY      Medical History: History reviewed. No pertinent past medical history.  Family History: Family History  Problem Relation Age of Onset  . Breast cancer Neg Hx       Review of Systems  Constitutional: Negative for activity change, chills, fatigue and unexpected weight change.  HENT: Negative for  congestion, postnasal drip, rhinorrhea, sneezing and sore throat.   Respiratory: Negative for cough, chest tightness, shortness of breath and wheezing.   Cardiovascular: Negative for chest pain and palpitations.  Gastrointestinal: Negative for abdominal pain, constipation, diarrhea, nausea and vomiting.  Endocrine: Negative for cold intolerance, heat intolerance, polydipsia and polyuria.  Genitourinary: Negative for dysuria and frequency.  Musculoskeletal: Positive for arthralgias and myalgias. Negative for back pain, joint swelling and neck pain.       Tenderness of right knee and right ankle.   Skin: Negative for rash.  Allergic/Immunologic: Negative for environmental allergies.  Neurological: Negative for dizziness, tremors, numbness and headaches.  Hematological: Negative for adenopathy. Does not bruise/bleed easily.  Psychiatric/Behavioral: Negative for behavioral problems (Depression), sleep disturbance and suicidal ideas. The patient is not nervous/anxious.    Today's Vitals   07/09/19 0857  BP: 119/87  Pulse: 74  Resp: 16  Temp: 97.8 F (36.6 C)  SpO2: 97%  Weight: 140 lb (63.5 kg)  Height: 5\' 2"  (1.575 m)   Body mass index is 25.61 kg/m.  Physical Exam Vitals signs and nursing note reviewed.  Constitutional:      General: She is not in acute distress.    Appearance: Normal appearance. She is well-developed. She is not diaphoretic.  HENT:     Head: Normocephalic and atraumatic.     Mouth/Throat:     Pharynx: No oropharyngeal exudate.  Eyes:     Pupils: Pupils are equal, round, and reactive to  light.  Neck:     Musculoskeletal: Normal range of motion and neck supple.     Thyroid: No thyromegaly.     Vascular: No carotid bruit or JVD.     Trachea: No tracheal deviation.  Cardiovascular:     Rate and Rhythm: Normal rate and regular rhythm.     Pulses: Normal pulses.     Heart sounds: Normal heart sounds. No murmur. No friction rub. No gallop.   Pulmonary:      Effort: Pulmonary effort is normal. No respiratory distress.     Breath sounds: Normal breath sounds. No wheezing or rales.  Chest:     Chest wall: No tenderness.     Breasts:        Right: Normal. No swelling, bleeding, inverted nipple, mass, nipple discharge, skin change or tenderness.        Left: Normal. No swelling, bleeding, inverted nipple, mass, nipple discharge, skin change or tenderness.  Abdominal:     General: Bowel sounds are normal.     Palpations: Abdomen is soft.  Musculoskeletal: Normal range of motion.  Lymphadenopathy:     Cervical: No cervical adenopathy.     Upper Body:     Right upper body: No supraclavicular or axillary adenopathy.     Left upper body: No supraclavicular or axillary adenopathy.  Skin:    General: Skin is warm and dry.  Neurological:     Mental Status: She is alert and oriented to person, place, and time.     Cranial Nerves: No cranial nerve deficit.  Psychiatric:        Behavior: Behavior normal.        Thought Content: Thought content normal.        Judgment: Judgment normal.      LABS: Recent Results (from the past 2160 hour(s))  Comprehensive metabolic panel     Status: None   Collection Time: 07/06/19  9:38 AM  Result Value Ref Range   Glucose 89 65 - 99 mg/dL   BUN 11 6 - 24 mg/dL   Creatinine, Ser 7.820.73 0.57 - 1.00 mg/dL   GFR calc non Af Amer 93 >59 mL/min/1.73   GFR calc Af Amer 107 >59 mL/min/1.73   BUN/Creatinine Ratio 15 9 - 23   Sodium 139 134 - 144 mmol/L   Potassium 4.1 3.5 - 5.2 mmol/L   Chloride 104 96 - 106 mmol/L   CO2 23 20 - 29 mmol/L   Calcium 9.6 8.7 - 10.2 mg/dL   Total Protein 6.9 6.0 - 8.5 g/dL   Albumin 4.4 3.8 - 4.9 g/dL   Globulin, Total 2.5 1.5 - 4.5 g/dL   Albumin/Globulin Ratio 1.8 1.2 - 2.2   Bilirubin Total 0.7 0.0 - 1.2 mg/dL   Alkaline Phosphatase 62 39 - 117 IU/L   AST 17 0 - 40 IU/L   ALT 13 0 - 32 IU/L  CBC     Status: None   Collection Time: 07/06/19  9:38 AM  Result Value Ref Range    WBC 4.3 3.4 - 10.8 x10E3/uL   RBC 4.46 3.77 - 5.28 x10E6/uL   Hemoglobin 12.3 11.1 - 15.9 g/dL   Hematocrit 95.638.2 21.334.0 - 46.6 %   MCV 86 79 - 97 fL   MCH 27.6 26.6 - 33.0 pg   MCHC 32.2 31.5 - 35.7 g/dL   RDW 08.613.1 57.811.7 - 46.915.4 %   Platelets 224 150 - 450 x10E3/uL  Lipid Panel w/o Chol/HDL Ratio  Status: Abnormal   Collection Time: 07/06/19  9:38 AM  Result Value Ref Range   Cholesterol, Total 191 100 - 199 mg/dL   Triglycerides 161145 0 - 149 mg/dL   HDL 54 >09>39 mg/dL   VLDL Cholesterol Cal 29 5 - 40 mg/dL   LDL Calculated 604108 (H) 0 - 99 mg/dL  Iron and TIBC     Status: None   Collection Time: 07/06/19  9:38 AM  Result Value Ref Range   Total Iron Binding Capacity 392 250 - 450 ug/dL   UIBC 540287 981131 - 191425 ug/dL   Iron 478105 27 - 295159 ug/dL   Iron Saturation 27 15 - 55 %  B12 and Folate Panel     Status: None   Collection Time: 07/06/19  9:38 AM  Result Value Ref Range   Vitamin B-12 721 232 - 1,245 pg/mL   Folate >20.0 >3.0 ng/mL    Comment: A serum folate concentration of less than 3.1 ng/mL is considered to represent clinical deficiency.   T4, free     Status: None   Collection Time: 07/06/19  9:38 AM  Result Value Ref Range   Free T4 0.88 0.82 - 1.77 ng/dL  TSH     Status: None   Collection Time: 07/06/19  9:38 AM  Result Value Ref Range   TSH 1.800 0.450 - 4.500 uIU/mL  VITAMIN D 25 Hydroxy (Vit-D Deficiency, Fractures)     Status: None   Collection Time: 07/06/19  9:38 AM  Result Value Ref Range   Vit D, 25-Hydroxy 36.8 30.0 - 100.0 ng/mL    Comment: Vitamin D deficiency has been defined by the Institute of Medicine and an Endocrine Society practice guideline as a level of serum 25-OH vitamin D less than 20 ng/mL (1,2). The Endocrine Society went on to further define vitamin D insufficiency as a level between 21 and 29 ng/mL (2). 1. IOM (Institute of Medicine). 2010. Dietary reference    intakes for calcium and D. [place]: The    [employer]. 2.  Holick MF, [place], Bischoff-Ferrari HA, et al.    Evaluation, treatment, and prevention of vitamin D    deficiency: an Endocrine Society clinical practice    guideline. JCEM. 2011 Jul; 96(7):1911-30.   Ferritin     Status: None   Collection Time: 07/06/19  9:38 AM  Result Value Ref Range   Ferritin 33 15 - 150 ng/mL   Assessment/Plan:  1. Encounter for general adult medical examination with abnormal findings Annual health maintenance exam today  2. Mixed hyperlipidemia Lipid panel stable. Continue crestor as prescribed  - rosuvastatin (CRESTOR) 5 MG tablet; Take 1 tablet (5 mg total) by mouth daily.  Dispense: 90 tablet; Refill: 4  3. Chronic pain of right knee Apply a compressive ACE bandage. Rest and elevate the affected painful area.  Apply cold compresses intermittently as needed.  As pain recedes, begin normal activities slowly as tolerated.  Recommend orthopedic follow up as needed  4. Screening for breast cancer - MM DIGITAL SCREENING BILATERAL; Future  5. Dysuria - Urinalysis, Routine w reflex microscopic   General Counseling: Anjelina verbalizes understanding of the findings of todays visit and agrees with plan of treatment. I have discussed any further diagnostic evaluation that may be needed or ordered today. We also reviewed her medications today. she has been encouraged to call the office with any questions or concerns that should arise related to todays visit.    Counseling:  This  patient was seen by Leretha Pol FNP Collaboration with Dr Lavera Guise as a part of collaborative care agreement  Orders Placed This Encounter  Procedures  . MM DIGITAL SCREENING BILATERAL  . Urinalysis, Routine w reflex microscopic    Meds ordered this encounter  Medications  . rosuvastatin (CRESTOR) 5 MG tablet    Sig: Take 1 tablet (5 mg total) by mouth daily.    Dispense:  90 tablet    Refill:  4    Order Specific Question:   Supervising Provider    Answer:   Lavera Guise [[id number]]    Time spent: [place], MD  Internal Medicine

## 2019-07-10 LAB — URINALYSIS, ROUTINE W REFLEX MICROSCOPIC
Bilirubin, UA: NEGATIVE
Glucose, UA: NEGATIVE
Ketones, UA: NEGATIVE
Leukocytes,UA: NEGATIVE
Nitrite, UA: NEGATIVE
Protein,UA: NEGATIVE
RBC, UA: NEGATIVE
Specific Gravity, UA: 1.005 (ref 1.005–1.030)
Urobilinogen, Ur: 0.2 mg/dL (ref 0.2–1.0)
pH, UA: 6.5 (ref 5.0–7.5)

## 2019-07-20 ENCOUNTER — Ambulatory Visit
Admission: RE | Admit: 2019-07-20 | Discharge: 2019-07-20 | Disposition: A | Discharge status: Home / Self Care | Payer: Managed Care, Other (non HMO) | Source: Ambulatory Visit | Attending: Nurse Practitioner | Admitting: Nurse Practitioner

## 2019-07-20 DIAGNOSIS — Z1231 Encounter for screening mammogram for malignant neoplasm of breast: Secondary | ICD-10-CM | POA: Diagnosis present

## 2019-07-20 DIAGNOSIS — Z1239 Encounter for other screening for malignant neoplasm of breast: Secondary | ICD-10-CM

## 2019-07-22 NOTE — Progress Notes (Signed)
Negative mammogram

## 2019-08-31 ENCOUNTER — Other Ambulatory Visit: Payer: Self-pay

## 2019-08-31 DIAGNOSIS — E559 Vitamin D deficiency, unspecified: Secondary | ICD-10-CM

## 2019-08-31 MED ORDER — ERGOCALCIFEROL 1.25 MG (50000 UT) PO CAPS: 50000.0000 [IU] | ORAL_CAPSULE | ORAL | 5 refills | Status: DC

## 2019-12-29 ENCOUNTER — Other Ambulatory Visit: Payer: Self-pay | Admitting: Nurse Practitioner

## 2019-12-30 LAB — COMPREHENSIVE METABOLIC PANEL
ALT: 22 IU/L (ref 0–32)
AST: 24 IU/L (ref 0–40)
Albumin/Globulin Ratio: 1.7 (ref 1.2–2.2)
Albumin: 4.5 g/dL (ref 3.8–4.9)
Alkaline Phosphatase: 63 IU/L (ref 39–117)
BUN/Creatinine Ratio: 14 (ref 9–23)
BUN: 10 mg/dL (ref 6–24)
Bilirubin Total: 0.7 mg/dL (ref 0.0–1.2)
CO2: 19 mmol/L — ABNORMAL LOW (ref 20–29)
Calcium: 9.8 mg/dL (ref 8.7–10.2)
Chloride: 105 mmol/L (ref 96–106)
Creatinine, Ser: 0.72 mg/dL (ref 0.57–1.00)
GFR calc Af Amer: 109 mL/min/{1.73_m2} (ref >59)
GFR calc non Af Amer: 95 mL/min/{1.73_m2} (ref >59)
Globulin, Total: 2.7 g/dL (ref 1.5–4.5)
Glucose: 90 mg/dL (ref 65–99)
Potassium: 4.2 mmol/L (ref 3.5–5.2)
Sodium: 138 mmol/L (ref 134–144)
Total Protein: 7.2 g/dL (ref 6.0–8.5)

## 2019-12-30 LAB — CBC
Hematocrit: 39 % (ref 34.0–46.6)
Hemoglobin: 13 g/dL (ref 11.1–15.9)
MCH: 28.4 pg (ref 26.6–33.0)
MCHC: 33.3 g/dL (ref 31.5–35.7)
MCV: 85 fL (ref 79–97)
Platelets: 227 10*3/uL (ref 150–450)
RBC: 4.57 x10E6/uL (ref 3.77–5.28)
RDW: 12.8 % (ref 11.7–15.4)
WBC: 4.6 10*3/uL (ref 3.4–10.8)

## 2019-12-30 LAB — IRON AND TIBC
Iron Saturation: 30 % (ref 15–55)
Iron: 110 ug/dL (ref 27–159)
Total Iron Binding Capacity: 361 ug/dL (ref 250–450)
UIBC: 251 ug/dL (ref 131–425)

## 2019-12-30 LAB — LIPID PANEL W/O CHOL/HDL RATIO
Cholesterol, Total: 148 mg/dL (ref 100–199)
HDL: 54 mg/dL (ref >39)
LDL Chol Calc (NIH): 72 mg/dL (ref 0–99)
Triglycerides: 128 mg/dL (ref 0–149)
VLDL Cholesterol Cal: 22 mg/dL (ref 5–40)

## 2019-12-30 LAB — T4, FREE: Free T4: 0.93 ng/dL (ref 0.82–1.77)

## 2019-12-30 LAB — B12 AND FOLATE PANEL
Folate: >20 ng/mL (ref >3.0)
Vitamin B-12: 922 pg/mL (ref 232–1245)

## 2019-12-30 LAB — FERRITIN: Ferritin: 37 ng/mL (ref 15–150)

## 2019-12-30 LAB — VITAMIN D 25 HYDROXY (VIT D DEFICIENCY, FRACTURES): Vit D, 25-Hydroxy: 51.7 ng/mL (ref 30.0–100.0)

## 2019-12-30 LAB — TSH: TSH: 2.86 u[IU]/mL (ref 0.450–4.500)

## 2020-01-01 ENCOUNTER — Other Ambulatory Visit: Payer: Managed Care, Other (non HMO) | Admitting: Nurse Practitioner

## 2020-01-05 ENCOUNTER — Telehealth: Payer: Self-pay

## 2020-01-05 NOTE — Progress Notes (Signed)
Please let the patient know that all labs were good. Thanks.

## 2020-01-05 NOTE — Telephone Encounter (Signed)
Pt advised labs were normal and mailed copy of labs

## 2020-01-05 NOTE — Progress Notes (Signed)
Lmom to call us back 

## 2020-01-05 NOTE — Telephone Encounter (Signed)
-----   Message from Carlean Jews, NP sent at 01/05/2020  8:27 AM EST ----- Please let the patient know that all labs were good. Thanks.

## 2020-06-23 ENCOUNTER — Other Ambulatory Visit: Payer: Self-pay | Admitting: Nurse Practitioner

## 2020-06-23 ENCOUNTER — Telehealth: Payer: Self-pay

## 2020-06-23 NOTE — Telephone Encounter (Signed)
Lmom to confirm and screen for 06-27-20 ov. 

## 2020-06-24 LAB — LIPID PANEL W/O CHOL/HDL RATIO
Cholesterol, Total: 167 mg/dL (ref 100–199)
HDL: 56 mg/dL (ref >39)
LDL Chol Calc (NIH): 86 mg/dL (ref 0–99)
Triglycerides: 143 mg/dL (ref 0–149)
VLDL Cholesterol Cal: 25 mg/dL (ref 5–40)

## 2020-06-24 LAB — COMPREHENSIVE METABOLIC PANEL
ALT: 15 IU/L (ref 0–32)
AST: 19 IU/L (ref 0–40)
Albumin/Globulin Ratio: 1.6 (ref 1.2–2.2)
Albumin: 4.3 g/dL (ref 3.8–4.9)
Alkaline Phosphatase: 62 IU/L (ref 48–121)
BUN/Creatinine Ratio: 13 (ref 9–23)
BUN: 9 mg/dL (ref 6–24)
Bilirubin Total: 0.8 mg/dL (ref 0.0–1.2)
CO2: 21 mmol/L (ref 20–29)
Calcium: 9.4 mg/dL (ref 8.7–10.2)
Chloride: 103 mmol/L (ref 96–106)
Creatinine, Ser: 0.68 mg/dL (ref 0.57–1.00)
GFR calc Af Amer: 113 mL/min/{1.73_m2} (ref >59)
GFR calc non Af Amer: 98 mL/min/{1.73_m2} (ref >59)
Globulin, Total: 2.7 g/dL (ref 1.5–4.5)
Glucose: 91 mg/dL (ref 65–99)
Potassium: 4.2 mmol/L (ref 3.5–5.2)
Sodium: 138 mmol/L (ref 134–144)
Total Protein: 7 g/dL (ref 6.0–8.5)

## 2020-06-24 LAB — T4, FREE: Free T4: 1.04 ng/dL (ref 0.82–1.77)

## 2020-06-24 LAB — CBC
Hematocrit: 39.1 % (ref 34.0–46.6)
Hemoglobin: 12.6 g/dL (ref 11.1–15.9)
MCH: 27.9 pg (ref 26.6–33.0)
MCHC: 32.2 g/dL (ref 31.5–35.7)
MCV: 87 fL (ref 79–97)
Platelets: 226 10*3/uL (ref 150–450)
RBC: 4.52 x10E6/uL (ref 3.77–5.28)
RDW: 13.3 % (ref 11.7–15.4)
WBC: 5.1 10*3/uL (ref 3.4–10.8)

## 2020-06-24 LAB — IRON AND TIBC
Iron Saturation: 37 % (ref 15–55)
Iron: 132 ug/dL (ref 27–159)
Total Iron Binding Capacity: 358 ug/dL (ref 250–450)
UIBC: 226 ug/dL (ref 131–425)

## 2020-06-24 LAB — VITAMIN D 25 HYDROXY (VIT D DEFICIENCY, FRACTURES): Vit D, 25-Hydroxy: 31.4 ng/mL (ref 30.0–100.0)

## 2020-06-24 LAB — TSH: TSH: 2.58 u[IU]/mL (ref 0.450–4.500)

## 2020-06-24 LAB — B12 AND FOLATE PANEL
Folate: >20 ng/mL (ref >3.0)
Vitamin B-12: 977 pg/mL (ref 232–1245)

## 2020-06-24 LAB — FERRITIN: Ferritin: 41 ng/mL (ref 15–150)

## 2020-06-27 ENCOUNTER — Other Ambulatory Visit: Payer: Self-pay

## 2020-06-27 ENCOUNTER — Ambulatory Visit (INDEPENDENT_AMBULATORY_CARE_PROVIDER_SITE_OTHER): Payer: Managed Care, Other (non HMO) | Admitting: Hospice and Palliative Medicine

## 2020-06-27 ENCOUNTER — Encounter: Payer: Self-pay | Admitting: Nurse Practitioner

## 2020-06-27 VITALS — BP 126/80 | HR 80 | Temp 97.1°F | Resp 16 | Ht 61.0 in | Wt 136.0 lb

## 2020-06-27 DIAGNOSIS — E559 Vitamin D deficiency, unspecified: Secondary | ICD-10-CM | POA: Diagnosis not present

## 2020-06-27 DIAGNOSIS — E785 Hyperlipidemia, unspecified: Secondary | ICD-10-CM | POA: Diagnosis not present

## 2020-06-27 DIAGNOSIS — M19071 Primary osteoarthritis, right ankle and foot: Secondary | ICD-10-CM | POA: Diagnosis not present

## 2020-06-27 DIAGNOSIS — Z0000 Encounter for general adult medical examination without abnormal findings: Secondary | ICD-10-CM

## 2020-06-27 DIAGNOSIS — Z124 Encounter for screening for malignant neoplasm of cervix: Secondary | ICD-10-CM | POA: Diagnosis not present

## 2020-06-27 DIAGNOSIS — R300 Dysuria: Secondary | ICD-10-CM

## 2020-06-27 MED ORDER — VITAMIN D (ERGOCALCIFEROL) 1.25 MG (50000 UNIT) PO CAPS: 50000.0000 [IU] | ORAL_CAPSULE | ORAL | 0 refills | Status: DC

## 2020-06-27 NOTE — Progress Notes (Signed)
[facility] [place], [place] [address]  Internal MEDICINE  Office Visit Note  Patient Name: Linda Silva  [id number]  [id number]  Date of Service: 06/28/2020  Chief Complaint  Patient presents with  . Annual Exam    Hep C, HIV screen, Tdap    HPI Pt is here for routine health maintenance examination. Overall has been doing very well. Reports she has not been taking her Crestor for some time. Her blood work shows a normal lipid panel. Her and her husband have changed their diet habits and are exercising more. Only complaint is that she is having intermittent pain in her right foot after standing for long periods of time. She had an injury that required surgical intervention to her right foot/ankle as a small child. Pain seems consistent with arthritis. At this time patient does not want imaging. Vitamin D levels again low will continue to replace with supplementation. BP well managed.   Current Medication: Outpatient Encounter Medications as of 06/27/2020  Medication Sig  . ergocalciferol (DRISDOL) 1.25 MG (50000 UT) capsule Take 1 capsule (50,000 Units total) by mouth once a week.  . rosuvastatin (CRESTOR) 5 MG tablet Take 1 tablet (5 mg total) by mouth daily.  . Vitamin D, Ergocalciferol, (DRISDOL) 1.25 MG (50000 UNIT) CAPS capsule Take 1 capsule (50,000 Units total) by mouth every 7 (seven) days.   No facility-administered encounter medications on file as of 06/27/2020.    Surgical History: Past Surgical History:  Procedure Laterality Date  . NECK SURGERY      Medical History: History reviewed. No pertinent past medical history.  Family History: Family History  Problem Relation Age of Onset  . Breast cancer Neg Hx     Review of Systems  Constitutional: Negative for chills, diaphoresis and fatigue.  HENT: Negative for ear pain, postnasal drip and sinus pressure.   Eyes: Negative for photophobia, discharge, redness, itching and visual  disturbance.  Respiratory: Negative for cough, shortness of breath and wheezing.   Cardiovascular: Negative for chest pain, palpitations and leg swelling.  Gastrointestinal: Negative for abdominal pain, constipation, diarrhea, nausea and vomiting.  Genitourinary: Negative for dysuria and flank pain.  Musculoskeletal: Negative for arthralgias, back pain, gait problem and neck pain.  Skin: Negative for color change.  Allergic/Immunologic: Negative for environmental allergies and food allergies.  Neurological: Negative for dizziness and headaches.  Hematological: Does not bruise/bleed easily.  Psychiatric/Behavioral: Negative for agitation, behavioral problems (depression) and hallucinations.    Vital Signs: BP 126/80   Pulse 80   Temp (!) 97.1 F (36.2 C)   Resp 16   Ht 5' 1"  (1.549 m)   Wt 136 lb (61.7 kg)   SpO2 98%   BMI 25.70 kg/m    Physical Exam Constitutional:      General: She is not in acute distress.    Appearance: She is well-developed. She is not diaphoretic.  HENT:     Head: Normocephalic and atraumatic.     Mouth/Throat:     Pharynx: No oropharyngeal exudate.  Eyes:     Pupils: Pupils are equal, round, and reactive to light.  Neck:     Thyroid: No thyromegaly.     Vascular: No JVD.     Trachea: No tracheal deviation.  Cardiovascular:     Rate and Rhythm: Normal rate and regular rhythm.     Heart sounds: Normal heart sounds. No murmur heard.  No friction rub. No gallop.   Pulmonary:  Effort: Pulmonary effort is normal. No respiratory distress.     Breath sounds: No wheezing or rales.  Chest:     Chest wall: No tenderness.     Breasts:        Right: Normal.        Left: Normal.  Abdominal:     General: Bowel sounds are normal.     Palpations: Abdomen is soft.  Musculoskeletal:        General: Normal range of motion.     Cervical back: Normal range of motion and neck supple.  Lymphadenopathy:     Cervical: No cervical adenopathy.  Skin:     General: Skin is warm and dry.  Neurological:     Mental Status: She is alert and oriented to person, place, and time.     Cranial Nerves: No cranial nerve deficit.  Psychiatric:        Behavior: Behavior normal.        Thought Content: Thought content normal.        Judgment: Judgment normal.      LABS: Recent Results (from the past 2160 hour(s))  Comprehensive metabolic panel     Status: None   Collection Time: 06/23/20  7:04 AM  Result Value Ref Range   Glucose 91 65 - 99 mg/dL   BUN 9 6 - 24 mg/dL   Creatinine, Ser 0.68 0.57 - 1.00 mg/dL   GFR calc non Af Amer 98 >59 mL/min/1.73   GFR calc Af Amer 113 >59 mL/min/1.73    Comment: **[facility] currently reports eGFR in compliance with the current**   recommendations of the [employer]. [facility] will   update reporting as new guidelines are published from the NKF-ASN   Task force.    BUN/Creatinine Ratio 13 9 - 23   Sodium 138 134 - 144 mmol/L   Potassium 4.2 3.5 - 5.2 mmol/L   Chloride 103 96 - 106 mmol/L   CO2 21 20 - 29 mmol/L   Calcium 9.4 8.7 - 10.2 mg/dL   Total Protein 7.0 6.0 - 8.5 g/dL   Albumin 4.3 3.8 - 4.9 g/dL   Globulin, Total 2.7 1.5 - 4.5 g/dL   Albumin/Globulin Ratio 1.6 1.2 - 2.2   Bilirubin Total 0.8 0.0 - 1.2 mg/dL   Alkaline Phosphatase 62 48 - 121 IU/L   AST 19 0 - 40 IU/L   ALT 15 0 - 32 IU/L  CBC     Status: None   Collection Time: 06/23/20  7:04 AM  Result Value Ref Range   WBC 5.1 3.4 - 10.8 x10E3/uL   RBC 4.52 3.77 - 5.28 x10E6/uL   Hemoglobin 12.6 11.1 - 15.9 g/dL   Hematocrit 39.1 34.0 - 46.6 %   MCV 87 79 - 97 fL   MCH 27.9 26.6 - 33.0 pg   MCHC 32.2 31 - 35 g/dL   RDW 13.3 11.7 - 15.4 %   Platelets 226 150 - 450 x10E3/uL  Lipid Panel w/o Chol/HDL Ratio     Status: None   Collection Time: 06/23/20  7:04 AM  Result Value Ref Range   Cholesterol, Total 167 100 - 199 mg/dL   Triglycerides 143 0 - 149 mg/dL   HDL 56 >39 mg/dL   VLDL Cholesterol Cal 25 5 - 40 mg/dL   LDL  Chol Calc (NIH) 86 0 - 99 mg/dL  Iron and TIBC     Status: None   Collection Time: 06/23/20  7:04 AM  Result  Value Ref Range   Total Iron Binding Capacity 358 250 - 450 ug/dL   UIBC 226 131 - 425 ug/dL   Iron 132 27 - 159 ug/dL   Iron Saturation 37 15 - 55 %  B12 and Folate Panel     Status: None   Collection Time: 06/23/20  7:04 AM  Result Value Ref Range   Vitamin B-12 977 232 - 1,245 pg/mL   Folate >20.0 >3.0 ng/mL    Comment: A serum folate concentration of less than 3.1 ng/mL is considered to represent clinical deficiency.   T4, free     Status: None   Collection Time: 06/23/20  7:04 AM  Result Value Ref Range   Free T4 1.04 0.82 - 1.77 ng/dL  TSH     Status: None   Collection Time: 06/23/20  7:04 AM  Result Value Ref Range   TSH 2.580 0.450 - 4.500 uIU/mL  VITAMIN D 25 Hydroxy (Vit-D Deficiency, Fractures)     Status: None   Collection Time: 06/23/20  7:04 AM  Result Value Ref Range   Vit D, 25-Hydroxy 31.4 30.0 - 100.0 ng/mL    Comment: Vitamin D deficiency has been defined by the [place] and an Endocrine Society practice guideline as a level of serum 25-OH vitamin D less than 20 ng/mL (1,2). The Endocrine Society went on to further define vitamin D insufficiency as a level between 21 and 29 ng/mL (2). 1. IOM (Institute of Medicine). 2010. Dietary reference    intakes for calcium and D. [place]: The    [employer]. 2. Holick MF, Binkley [place], Bischoff-Ferrari HA, et al.    Evaluation, treatment, and prevention of vitamin D    deficiency: an Endocrine Society clinical practice    guideline. JCEM. 2011 Jul; 96(7):1911-30.   Ferritin     Status: None   Collection Time: 06/23/20  7:04 AM  Result Value Ref Range   Ferritin 41 15.0 - 150.0 ng/mL  UA/M w/rflx Culture, Routine     Status: None   Collection Time: 06/27/20  3:41 PM   Specimen: Urine   Urine  Result Value Ref Range   Specific Gravity, UA 1.005 1.005 - 1.030   pH, UA 7.0  5.0 - 7.5   Color, UA Yellow Yellow   Appearance Ur Clear Clear   Leukocytes,UA Negative Negative   Protein,UA Negative Negative/Trace   Glucose, UA Negative Negative   Ketones, UA Negative Negative   RBC, UA Negative Negative   Bilirubin, UA Negative Negative   Urobilinogen, Ur 0.2 0.2 - 1.0 mg/dL   Nitrite, UA Negative Negative   Microscopic Examination Comment     Comment: Microscopic follows if indicated.   Microscopic Examination See below:     Comment: Microscopic was indicated and was performed.   Urinalysis Reflex Comment     Comment: This specimen will not reflex to a Urine Culture.  Microscopic Examination     Status: None   Collection Time: 06/27/20  3:41 PM   Urine  Result Value Ref Range   WBC, UA None seen 0 - 5 /hpf   RBC None seen 0 - 2 /hpf   Epithelial Cells (non renal) None seen 0 - 10 /hpf   Casts None seen None seen /lpf   Bacteria, UA None seen None seen/Few   Assessment/Plan: 1. Encounter for general adult medical examination w/o abnormal findings Well appearing 54 year old female. Up to date on PHM at this time.  2. Osteoarthritis of ankle and foot, right Discussed taking OTC acetaminophen when right ankle causes her pain. Also discussed using a heating pad as well as taking breaks from standing on her foot for extended periods of time. Will monitor at this time for symptomatic relief.  3. Hyperlipidemia, unspecified hyperlipidemia type Has not routinely taken her Crestor for some time. Has changed her diet habits and increased her exercise routine. At this time will stay off Crestor and will monitor her lipid panel.  4. Dysuria - UA/M w/rflx Culture, Routine - Microscopic Examination  5. Vitamin D deficiency Continue with vitamin D supplementation, will recheck levels. - Vitamin D, Ergocalciferol, (DRISDOL) 1.25 MG (50000 UNIT) CAPS capsule; Take 1 capsule (50,000 Units total) by mouth every 7 (seven) days.  Dispense: 5 capsule; Refill: 0  General  Counseling: Maurine verbalizes understanding of the findings of todays visit and agrees with plan of treatment. I have discussed any further diagnostic evaluation that may be needed or ordered today. We also reviewed her medications today. she has been encouraged to call the office with any questions or concerns that should arise related to todays visit.    Counseling:    Orders Placed This Encounter  Procedures  . Microscopic Examination  . UA/M w/rflx Culture, Routine    Meds ordered this encounter  Medications  . Vitamin D, Ergocalciferol, (DRISDOL) 1.25 MG (50000 UNIT) CAPS capsule    Sig: Take 1 capsule (50,000 Units total) by mouth every 7 (seven) days.    Dispense:  5 capsule    Refill:  0    Total time spent: 30 Minutes  This patient was seen by Theodoro Grist, AGNP-C in collaboration with Dr. Lavera Guise as part of a collaborative care agreement.  Time spent includes review of chart, medications, test results, and follow up plan with the patient.   Theodoro Grist, AGNP-C  Lavera Guise, MD Internal Medicine

## 2020-06-28 LAB — MICROSCOPIC EXAMINATION
Bacteria, UA: NONE SEEN
Casts: NONE SEEN /lpf
Epithelial Cells (non renal): NONE SEEN /hpf (ref 0–10)
RBC, Urine: NONE SEEN /hpf (ref 0–2)
WBC, UA: NONE SEEN /hpf (ref 0–5)

## 2020-06-28 LAB — UA/M W/RFLX CULTURE, ROUTINE
Bilirubin, UA: NEGATIVE
Glucose, UA: NEGATIVE
Ketones, UA: NEGATIVE
Leukocytes,UA: NEGATIVE
Nitrite, UA: NEGATIVE
Protein,UA: NEGATIVE
RBC, UA: NEGATIVE
Specific Gravity, UA: 1.005 (ref 1.005–1.030)
Urobilinogen, Ur: 0.2 mg/dL (ref 0.2–1.0)
pH, UA: 7 (ref 5.0–7.5)

## 2020-07-11 ENCOUNTER — Other Ambulatory Visit: Payer: Managed Care, Other (non HMO) | Admitting: Nurse Practitioner

## 2020-07-12 ENCOUNTER — Other Ambulatory Visit: Payer: Managed Care, Other (non HMO) | Admitting: Nurse Practitioner

## 2020-07-12 NOTE — Progress Notes (Signed)
Labs already discuss with pt at last visit

## 2020-07-21 ENCOUNTER — Other Ambulatory Visit: Payer: Self-pay | Admitting: Internal Medicine

## 2020-07-21 DIAGNOSIS — E559 Vitamin D deficiency, unspecified: Secondary | ICD-10-CM

## 2021-03-16 ENCOUNTER — Other Ambulatory Visit: Payer: Self-pay | Admitting: Chiropractor

## 2021-03-16 DIAGNOSIS — M25562 Pain in left knee: Secondary | ICD-10-CM

## 2021-03-24 ENCOUNTER — Ambulatory Visit
Admission: RE | Admit: 2021-03-24 | Discharge: 2021-03-24 | Disposition: A | Discharge status: Home / Self Care | Payer: Managed Care, Other (non HMO) | Source: Ambulatory Visit | Attending: Chiropractor | Admitting: Chiropractor

## 2021-03-24 DIAGNOSIS — M25562 Pain in left knee: Secondary | ICD-10-CM | POA: Diagnosis present

## 2021-06-06 ENCOUNTER — Telehealth: Payer: Self-pay

## 2021-06-06 NOTE — Telephone Encounter (Signed)
Can we not order in epic, its easier

## 2021-06-08 ENCOUNTER — Telehealth: Payer: Self-pay

## 2021-06-08 ENCOUNTER — Other Ambulatory Visit: Payer: Self-pay | Admitting: Internal Medicine

## 2021-06-08 DIAGNOSIS — Z0000 Encounter for general adult medical examination without abnormal findings: Secondary | ICD-10-CM

## 2021-06-08 DIAGNOSIS — E785 Hyperlipidemia, unspecified: Secondary | ICD-10-CM

## 2021-06-08 NOTE — Telephone Encounter (Signed)
LMOM to let pt know that the lab orders are in Epic for her to have done thru [facility].  Advised pt to call back if any questions.

## 2021-06-21 LAB — COMPREHENSIVE METABOLIC PANEL
ALT: 15 IU/L (ref 0–32)
AST: 15 IU/L (ref 0–40)
Albumin/Globulin Ratio: 1.7 (ref 1.2–2.2)
Albumin: 4.3 g/dL (ref 3.8–4.9)
Alkaline Phosphatase: 68 IU/L (ref 44–121)
BUN/Creatinine Ratio: 14 (ref 9–23)
BUN: 10 mg/dL (ref 6–24)
Bilirubin Total: 0.5 mg/dL (ref 0.0–1.2)
CO2: 21 mmol/L (ref 20–29)
Calcium: 9.4 mg/dL (ref 8.7–10.2)
Chloride: 99 mmol/L (ref 96–106)
Creatinine, Ser: 0.69 mg/dL (ref 0.57–1.00)
Globulin, Total: 2.6 g/dL (ref 1.5–4.5)
Glucose: 94 mg/dL (ref 65–99)
Potassium: 3.9 mmol/L (ref 3.5–5.2)
Sodium: 136 mmol/L (ref 134–144)
Total Protein: 6.9 g/dL (ref 6.0–8.5)
eGFR: 101 mL/min/{1.73_m2} (ref >59)

## 2021-06-21 LAB — CBC WITH DIFFERENTIAL/PLATELET
Basophils Absolute: 0 10*3/uL (ref 0.0–0.2)
Basos: 0 %
EOS (ABSOLUTE): 0.1 10*3/uL (ref 0.0–0.4)
Eos: 1 %
Hematocrit: 38.6 % (ref 34.0–46.6)
Hemoglobin: 12.9 g/dL (ref 11.1–15.9)
Immature Grans (Abs): 0 10*3/uL (ref 0.0–0.1)
Immature Granulocytes: 0 %
Lymphocytes Absolute: 2.2 10*3/uL (ref 0.7–3.1)
Lymphs: 44 %
MCH: 28.5 pg (ref 26.6–33.0)
MCHC: 33.4 g/dL (ref 31.5–35.7)
MCV: 85 fL (ref 79–97)
Monocytes Absolute: 0.4 10*3/uL (ref 0.1–0.9)
Monocytes: 7 %
Neutrophils Absolute: 2.4 10*3/uL (ref 1.4–7.0)
Neutrophils: 48 %
Platelets: 231 10*3/uL (ref 150–450)
RBC: 4.53 x10E6/uL (ref 3.77–5.28)
RDW: 13.1 % (ref 11.7–15.4)
WBC: 5 10*3/uL (ref 3.4–10.8)

## 2021-06-21 LAB — LIPID PANEL WITH LDL/HDL RATIO
Cholesterol, Total: 225 mg/dL — ABNORMAL HIGH (ref 100–199)
HDL: 49 mg/dL (ref >39)
LDL Chol Calc (NIH): 130 mg/dL — ABNORMAL HIGH (ref 0–99)
LDL/HDL Ratio: 2.7 ratio (ref 0.0–3.2)
Triglycerides: 260 mg/dL — ABNORMAL HIGH (ref 0–149)
VLDL Cholesterol Cal: 46 mg/dL — ABNORMAL HIGH (ref 5–40)

## 2021-06-21 LAB — T4, FREE: Free T4: 0.96 ng/dL (ref 0.82–1.77)

## 2021-06-21 LAB — TSH: TSH: 3.31 u[IU]/mL (ref 0.450–4.500)

## 2021-06-27 ENCOUNTER — Other Ambulatory Visit: Payer: Self-pay

## 2021-06-27 ENCOUNTER — Ambulatory Visit (INDEPENDENT_AMBULATORY_CARE_PROVIDER_SITE_OTHER): Payer: Managed Care, Other (non HMO) | Admitting: Internal Medicine

## 2021-06-27 ENCOUNTER — Encounter: Payer: Self-pay | Admitting: Internal Medicine

## 2021-06-27 DIAGNOSIS — R300 Dysuria: Secondary | ICD-10-CM

## 2021-06-27 DIAGNOSIS — Z124 Encounter for screening for malignant neoplasm of cervix: Secondary | ICD-10-CM | POA: Diagnosis not present

## 2021-06-27 DIAGNOSIS — R102 Pelvic and perineal pain: Secondary | ICD-10-CM | POA: Diagnosis not present

## 2021-06-27 DIAGNOSIS — E785 Hyperlipidemia, unspecified: Secondary | ICD-10-CM

## 2021-06-27 DIAGNOSIS — Z1231 Encounter for screening mammogram for malignant neoplasm of breast: Secondary | ICD-10-CM | POA: Diagnosis not present

## 2021-06-27 DIAGNOSIS — Z789 Other specified health status: Secondary | ICD-10-CM | POA: Insufficient documentation

## 2021-06-27 DIAGNOSIS — Z0000 Encounter for general adult medical examination without abnormal findings: Secondary | ICD-10-CM

## 2021-06-27 DIAGNOSIS — E782 Mixed hyperlipidemia: Secondary | ICD-10-CM | POA: Diagnosis not present

## 2021-06-27 MED ORDER — ROSUVASTATIN CALCIUM 5 MG PO TABS: 5.0000 mg | ORAL_TABLET | Freq: Every day | ORAL | 4 refills | Status: DC

## 2021-06-27 NOTE — Progress Notes (Signed)
[facility] [place], [place] [address]  Internal MEDICINE  Office Visit Note  Patient Name: Linda Silva  [id number]  [id number]  Date of Service: 06/27/2021  Chief Complaint  Patient presents with   Annual Exam    With Pap Smear   Results    Review lab results   Quality Metric Gaps    Hep C screen, Flu Vaccine, mammogram     HPI Pt is here for routine health maintenance examination, she feels well, she would like to have a Pap smear today even though she is not due. She also stopped taking her cholesterol medicine as she ran out of her refills Patient is up-to-date on preventive health maintenance denies any major complaints no fever no chills nausea vomiting diarrhea constipation Only other complaint she has that occasionally pelvic pain Denies any discharge The 10-year ASCVD risk score Mikey Bussing DC Brooke Bonito., et al., 2013) is: 2.8%   Values used to calculate the score:     Age: 55 years     Sex: Female     Is Non-Hispanic African American: No     Diabetic: No     Tobacco smoker: No     Systolic Blood Pressure: 720 mmHg     Is BP treated: No     HDL Cholesterol: 49 mg/dL     Total Cholesterol: 225 mg/dL    Current Medication: Outpatient Encounter Medications as of 06/27/2021  Medication Sig   ergocalciferol (DRISDOL) 1.25 MG (50000 UT) capsule Take 1 capsule (50,000 Units total) by mouth once a week. (Patient not taking: Reported on 06/27/2021)   rosuvastatin (CRESTOR) 5 MG tablet Take 1 tablet (5 mg total) by mouth daily. (Patient not taking: Reported on 06/27/2021)   Vitamin D, Ergocalciferol, (DRISDOL) 1.25 MG (50000 UNIT) CAPS capsule TAKE 1 CAPSULE BY MOUTH  EVERY 7 DAYS (Patient not taking: Reported on 06/27/2021)   No facility-administered encounter medications on file as of 06/27/2021.    Surgical History: Past Surgical History:  Procedure Laterality Date   NECK SURGERY      Medical History: Past Medical History:  Diagnosis Date   No  pertinent past medical history     Family History: Family History  Problem Relation Age of Onset   Breast cancer Neg Hx     Social History: Social History   Socioeconomic History   Marital status: Married    Spouse name: Not on file   Number of children: Not on file   Years of education: Not on file   Highest education level: Not on file  Occupational History   Not on file  Tobacco Use   Smoking status: Never   Smokeless tobacco: Never  Substance and Sexual Activity   Alcohol use: Never   Drug use: Never   Sexual activity: Not on file  Other Topics Concern   Not on file  Social History Narrative   Not on file   Social Determinants of Health   Financial Resource Strain: Not on file  Food Insecurity: Not on file  Transportation Needs: Not on file  Physical Activity: Not on file  Stress: Not on file  Social Connections: Not on file      Review of Systems  Constitutional:  Negative for chills, diaphoresis and fatigue.  HENT:  Negative for ear pain, postnasal drip and sinus pressure.   Eyes:  Negative for photophobia, discharge, redness, itching and visual disturbance.  Respiratory:  Negative for cough, shortness of breath and wheezing.  Cardiovascular:  Negative for chest pain, palpitations and leg swelling.  Gastrointestinal:  Negative for abdominal pain, constipation, diarrhea, nausea and vomiting.  Genitourinary:  Negative for dysuria and flank pain.  Musculoskeletal:  Negative for arthralgias, back pain, gait problem and neck pain.  Skin:  Negative for color change.  Allergic/Immunologic: Negative for environmental allergies and food allergies.  Neurological:  Negative for dizziness and headaches.  Hematological:  Does not bruise/bleed easily.  Psychiatric/Behavioral:  Negative for agitation, behavioral problems (depression) and hallucinations.     Vital Signs: BP 124/80   Pulse 84   Temp 98.6 F (37 C)   Resp 16   Ht _0  (1.549 m)   Wt 137 lb 3.2  oz (62.2 kg)   SpO2 99%   BMI 25.92 kg/m    Physical Exam Constitutional:      General: She is not in acute distress.    Appearance: She is well-developed. She is not diaphoretic.  HENT:     Head: Normocephalic and atraumatic.     Mouth/Throat:     Pharynx: No oropharyngeal exudate.  Eyes:     Extraocular Movements: Extraocular movements intact.     Pupils: Pupils are equal, round, and reactive to light.  Neck:     Thyroid: No thyromegaly.     Vascular: No JVD.     Trachea: No tracheal deviation.  Cardiovascular:     Rate and Rhythm: Normal rate and regular rhythm.     Heart sounds: Normal heart sounds. No murmur heard.   No friction rub. No gallop.  Pulmonary:     Effort: Pulmonary effort is normal. No respiratory distress.     Breath sounds: No wheezing or rales.  Chest:     Chest wall: No tenderness.  Breasts:    Right: Normal. No mass.     Left: Normal. No mass.     Comments: Normal  Abdominal:     General: Bowel sounds are normal.     Palpations: Abdomen is soft.  Genitourinary:    General: Normal vulva.     Vagina: No vaginal discharge.     Rectum: Normal.  Musculoskeletal:        General: Normal range of motion.     Cervical back: Normal range of motion and neck supple.  Lymphadenopathy:     Cervical: No cervical adenopathy.  Skin:    General: Skin is warm and dry.  Neurological:     Mental Status: She is alert and oriented to person, place, and time.     Cranial Nerves: No cranial nerve deficit.  Psychiatric:        Behavior: Behavior normal.        Thought Content: Thought content normal.        Judgment: Judgment normal.      LABS: Recent Results (from the past 2160 hour(s))  CBC with Differential/Platelet     Status: None   Collection Time: 06/20/21  7:05 AM  Result Value Ref Range   WBC 5.0 3.4 - 10.8 x10E3/uL   RBC 4.53 3.77 - 5.28 x10E6/uL   Hemoglobin 12.9 11.1 - 15.9 g/dL   Hematocrit 38.6 34.0 - 46.6 %   MCV 85 79 - 97 fL   MCH  28.5 26.6 - 33.0 pg   MCHC 33.4 31.5 - 35.7 g/dL   RDW 13.1 11.7 - 15.4 %   Platelets 231 150 - 450 x10E3/uL   Neutrophils 48 Not Estab. %   Lymphs 44 Not Estab. %  Monocytes 7 Not Estab. %   Eos 1 Not Estab. %   Basos 0 Not Estab. %   Neutrophils Absolute 2.4 1.4 - 7.0 x10E3/uL   Lymphocytes Absolute 2.2 0.7 - 3.1 x10E3/uL   Monocytes Absolute 0.4 0.1 - 0.9 x10E3/uL   EOS (ABSOLUTE) 0.1 0.0 - 0.4 x10E3/uL   Basophils Absolute 0.0 0.0 - 0.2 x10E3/uL   Immature Granulocytes 0 Not Estab. %   Immature Grans (Abs) 0.0 0.0 - 0.1 x10E3/uL  Lipid Panel With LDL/HDL Ratio     Status: Abnormal   Collection Time: 06/20/21  7:05 AM  Result Value Ref Range   Cholesterol, Total 225 (H) 100 - 199 mg/dL   Triglycerides 260 (H) 0 - 149 mg/dL   HDL 49 >39 mg/dL   VLDL Cholesterol Cal 46 (H) 5 - 40 mg/dL   LDL Chol Calc (NIH) 130 (H) 0 - 99 mg/dL   LDL/HDL Ratio 2.7 0.0 - 3.2 ratio    Comment:                                     LDL/HDL Ratio                                             Men  Women                               1/2 Avg.Risk  1.0    1.5                                   Avg.Risk  3.6    3.2                                2X Avg.Risk  6.2    5.0                                3X Avg.Risk  8.0    6.1   TSH     Status: None   Collection Time: 06/20/21  7:05 AM  Result Value Ref Range   TSH 3.310 0.450 - 4.500 uIU/mL  T4, free     Status: None   Collection Time: 06/20/21  7:05 AM  Result Value Ref Range   Free T4 0.96 0.82 - 1.77 ng/dL  Comprehensive metabolic panel     Status: None   Collection Time: 06/20/21  7:05 AM  Result Value Ref Range   Glucose 94 65 - 99 mg/dL   BUN 10 6 - 24 mg/dL   Creatinine, Ser 0.69 0.57 - 1.00 mg/dL   eGFR 101 >59 mL/min/1.73   BUN/Creatinine Ratio 14 9 - 23   Sodium 136 134 - 144 mmol/L   Potassium 3.9 3.5 - 5.2 mmol/L   Chloride 99 96 - 106 mmol/L   CO2 21 20 - 29 mmol/L   Calcium 9.4 8.7 - 10.2 mg/dL   Total Protein 6.9 6.0 - 8.5 g/dL    Albumin 4.3 3.8 - 4.9 g/dL   Globulin, Total 2.6 1.5 - 4.5 g/dL  Albumin/Globulin Ratio 1.7 1.2 - 2.2   Bilirubin Total 0.5 0.0 - 1.2 mg/dL   Alkaline Phosphatase 68 44 - 121 IU/L   AST 15 0 - 40 IU/L   ALT 15 0 - 32 IU/L      Assessment/Plan: 1. Encounter for general adult medical examination w/o abnormal findings Update preventive health maintenance including mammogram patient is not due for her bone density previous bone density has been normal   2. Screening for malignant neoplasm of cervix No gross abnormalities on her pelvic exam - IGP, Aptima HPV  3. Mixed hyperlipidemia Will restart Crestor 5 mg once a day - rosuvastatin (CRESTOR) 5 MG tablet; Take 1 tablet (5 mg total) by mouth daily.  Dispense: 90 tablet; Refill: 4  4. Encounter for mammogram to establish baseline mammogram Mammogram is ordered - MM DIGITAL SCREENING BILATERAL; Future  5. Pelvic pain Patient is having episodes of pelvic pain we will order a pelvic ultrasound - US PELVIS (TRANSABDOMINAL ONLY); Future  6. Dysuria  - UA/M w/rflx Culture, Routine    General Counseling: Laikynn verbalizes understanding of the findings of todays visit and agrees with plan of treatment. I have discussed any further diagnostic evaluation that may be needed or ordered today. We also reviewed her medications today. she has been encouraged to call the office with any questions or concerns that should arise related to todays visit.    Counseling:  [place] Controlled Substance Database was reviewed by me.  Orders Placed This Encounter  Procedures   UA/M w/rflx Culture, Routine    No orders of the defined types were placed in this encounter.   Total time spent:45 Minutes  Time spent includes review of chart, medications, test results, and follow up plan with the patient.     Lavera Guise, MD  Internal Medicine

## 2021-06-28 LAB — UA/M W/RFLX CULTURE, ROUTINE
Bilirubin, UA: NEGATIVE
Glucose, UA: NEGATIVE
Ketones, UA: NEGATIVE
Leukocytes,UA: NEGATIVE
Nitrite, UA: NEGATIVE
Protein,UA: NEGATIVE
RBC, UA: NEGATIVE
Specific Gravity, UA: 1.006 (ref 1.005–1.030)
Urobilinogen, Ur: 0.2 mg/dL (ref 0.2–1.0)
pH, UA: 7.5 (ref 5.0–7.5)

## 2021-06-28 LAB — MICROSCOPIC EXAMINATION
Bacteria, UA: NONE SEEN
Casts: NONE SEEN /lpf
Epithelial Cells (non renal): NONE SEEN /hpf (ref 0–10)
RBC, Urine: NONE SEEN /hpf (ref 0–2)
WBC, UA: NONE SEEN /hpf (ref 0–5)

## 2021-06-29 LAB — IGP, APTIMA HPV: HPV Aptima: NEGATIVE

## 2021-07-12 IMAGING — CR DG KNEE COMPLETE 4+V*L*
1 series · 5 of 5 positions shown · non-contrast
Comparison: None.

CLINICAL DATA: Left knee pain.

EXAM:
LEFT KNEE - COMPLETE 4+ VIEW

[Series 1: dg knee complete 4 views left · 0.14mm/px · 5 of 5 slices shown]
[im 1/5]
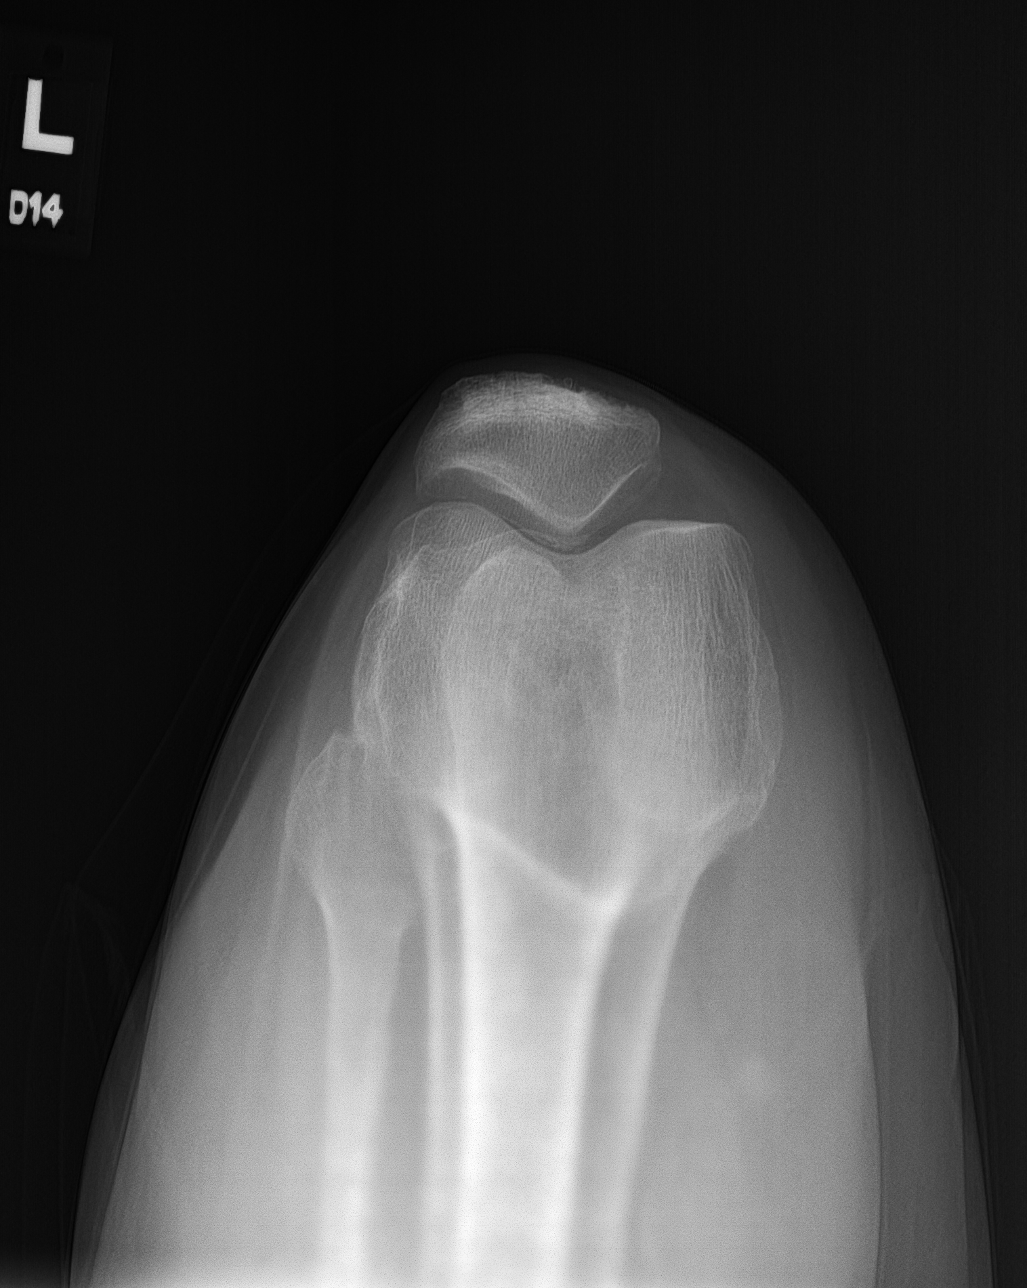
[im 2/5]
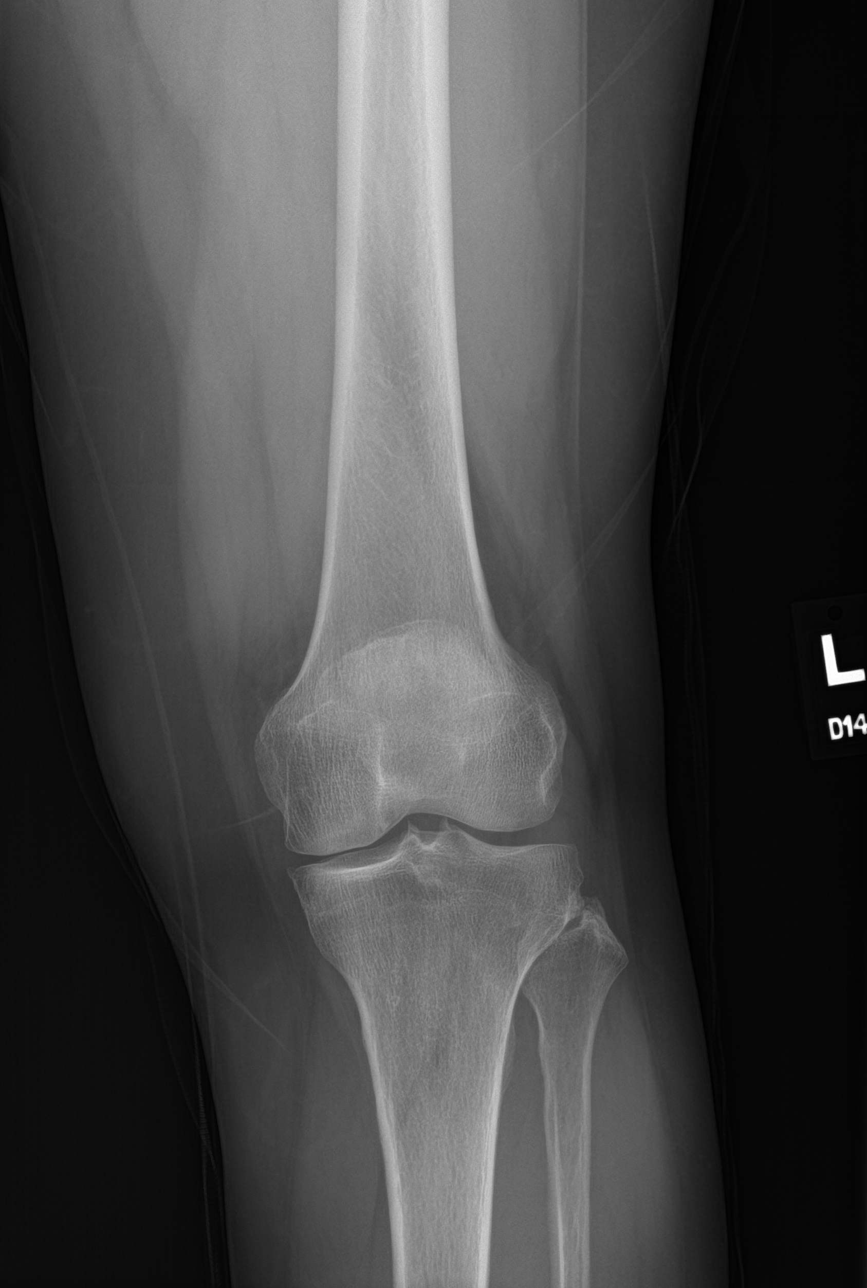
[im 3/5]
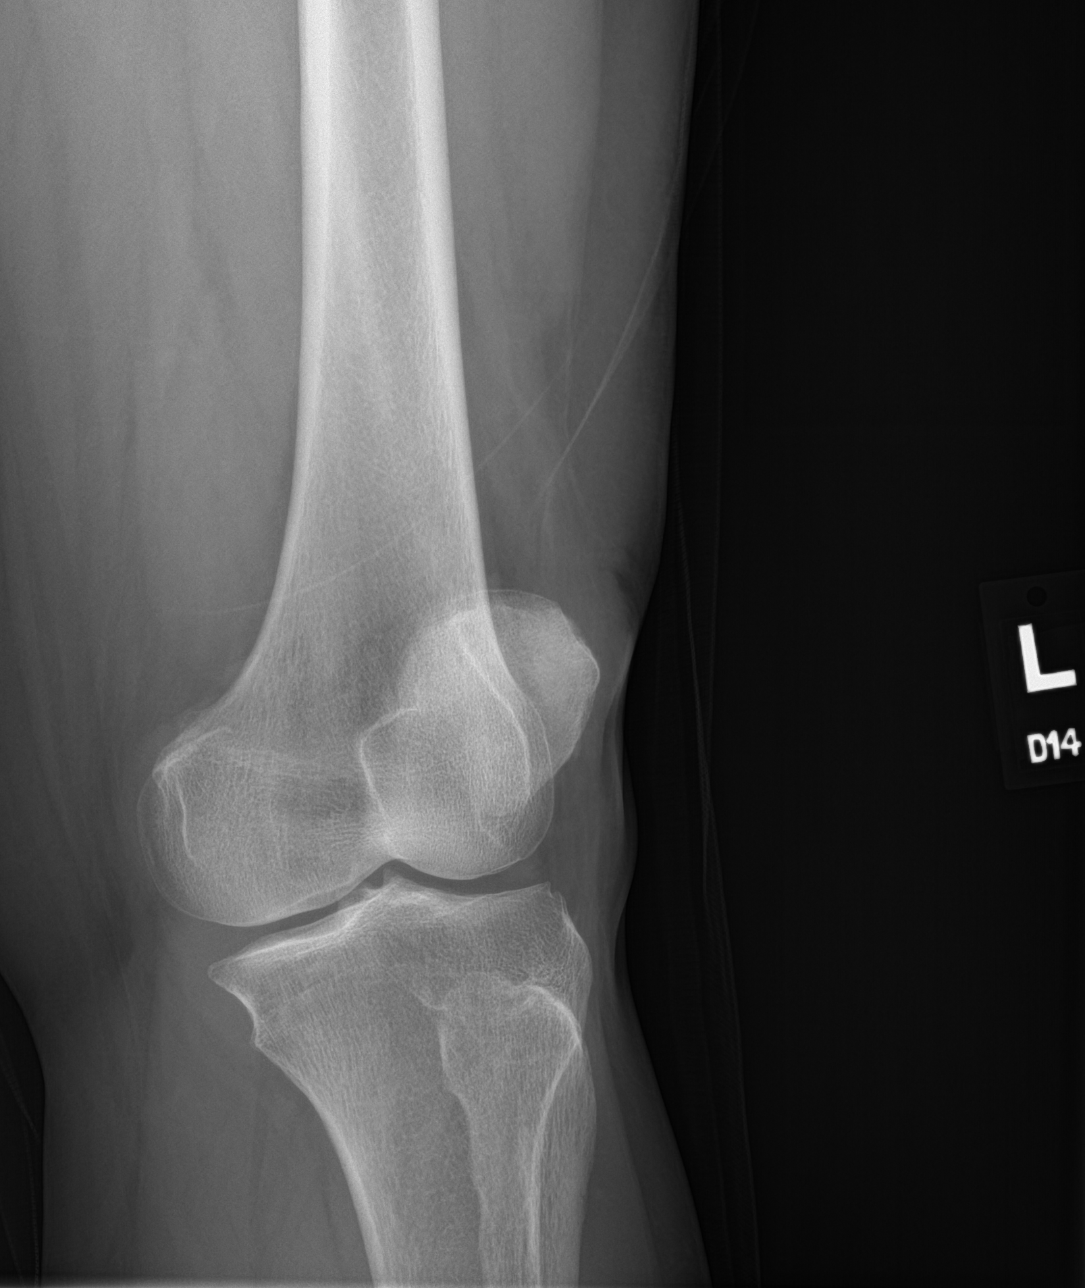
[im 4/5]
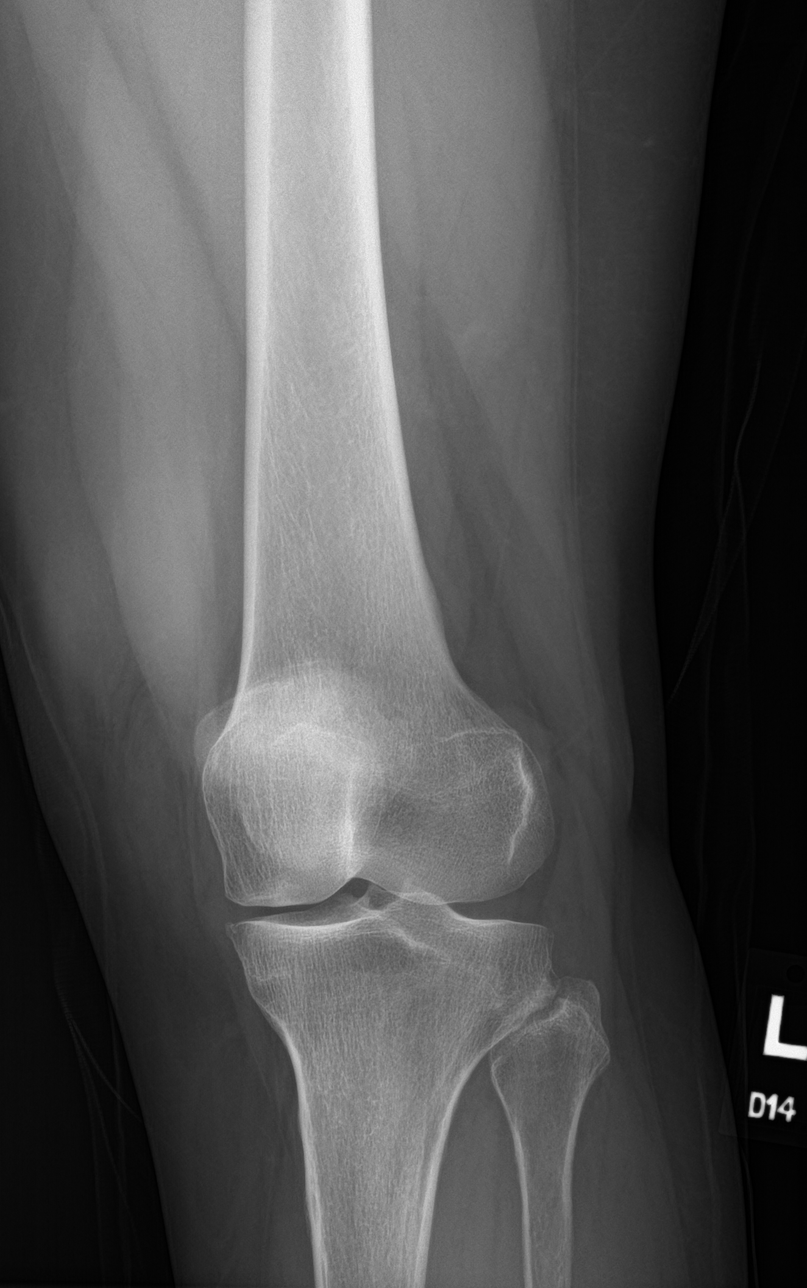
[im 5/5]
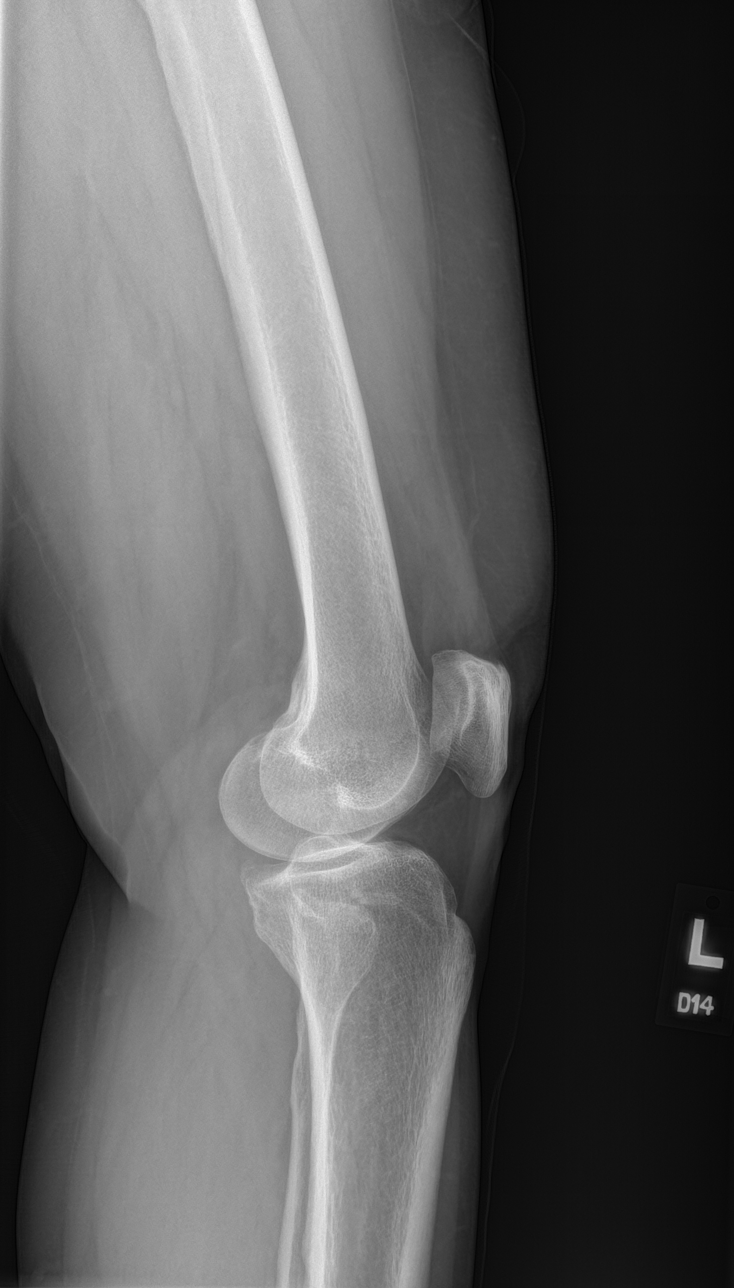

[5 of 5 positions shown; findings below may reference images not displayed]

FINDINGS: No acute fracture or dislocation. No aggressive osseous lesion.
Normal alignment. Mild medial femorotibial compartment joint space
narrowing. Lateral femorotibial compartment patellofemoral
compartment joint spaces are maintained. No significant joint
effusion.

Soft tissue are unremarkable. No radiopaque foreign body or soft
tissue emphysema.
IMPRESSION: 1.  No acute osseous injury of the left knee.
2. Mild medial femorotibial compartment osteoarthritis.

## 2021-07-17 ENCOUNTER — Other Ambulatory Visit: Payer: Self-pay | Admitting: Internal Medicine

## 2021-07-17 DIAGNOSIS — Z1231 Encounter for screening mammogram for malignant neoplasm of breast: Secondary | ICD-10-CM

## 2021-07-27 ENCOUNTER — Ambulatory Visit
Admission: RE | Admit: 2021-07-27 | Discharge: 2021-07-27 | Disposition: A | Discharge status: Home / Self Care | Payer: Managed Care, Other (non HMO) | Source: Ambulatory Visit | Attending: Internal Medicine | Admitting: Internal Medicine

## 2021-07-27 ENCOUNTER — Other Ambulatory Visit: Payer: Self-pay

## 2021-07-27 DIAGNOSIS — Z1231 Encounter for screening mammogram for malignant neoplasm of breast: Secondary | ICD-10-CM | POA: Insufficient documentation

## 2021-08-02 ENCOUNTER — Other Ambulatory Visit: Payer: Managed Care, Other (non HMO)

## 2021-08-14 ENCOUNTER — Ambulatory Visit: Payer: Managed Care, Other (non HMO) | Admitting: Internal Medicine

## 2022-01-02 ENCOUNTER — Encounter: Payer: Managed Care, Other (non HMO) | Admitting: Internal Medicine

## 2022-02-23 ENCOUNTER — Telehealth: Payer: Self-pay

## 2022-03-05 ENCOUNTER — Other Ambulatory Visit: Payer: Self-pay

## 2022-03-05 DIAGNOSIS — E509 Vitamin A deficiency, unspecified: Secondary | ICD-10-CM

## 2022-03-05 DIAGNOSIS — Z0001 Encounter for general adult medical examination with abnormal findings: Secondary | ICD-10-CM

## 2022-03-05 NOTE — Telephone Encounter (Signed)
Pt advised that we add labs order  ?

## 2022-06-05 ENCOUNTER — Telehealth: Payer: Self-pay

## 2022-06-06 NOTE — Telephone Encounter (Signed)
error 

## 2022-06-12 ENCOUNTER — Encounter: Payer: Managed Care, Other (non HMO) | Admitting: Internal Medicine

## 2022-06-18 ENCOUNTER — Encounter: Payer: Managed Care, Other (non HMO) | Admitting: Internal Medicine

## 2022-07-06 ENCOUNTER — Other Ambulatory Visit: Payer: Self-pay | Admitting: Internal Medicine

## 2022-07-06 DIAGNOSIS — E782 Mixed hyperlipidemia: Secondary | ICD-10-CM

## 2022-07-06 DIAGNOSIS — Z1231 Encounter for screening mammogram for malignant neoplasm of breast: Secondary | ICD-10-CM

## 2022-07-06 DIAGNOSIS — Z0000 Encounter for general adult medical examination without abnormal findings: Secondary | ICD-10-CM

## 2022-07-06 DIAGNOSIS — R102 Pelvic and perineal pain: Secondary | ICD-10-CM

## 2022-07-06 DIAGNOSIS — R300 Dysuria: Secondary | ICD-10-CM

## 2022-07-06 DIAGNOSIS — Z124 Encounter for screening for malignant neoplasm of cervix: Secondary | ICD-10-CM

## 2022-07-13 LAB — CBC WITH DIFFERENTIAL/PLATELET
Basophils Absolute: 0 10*3/uL (ref 0.0–0.2)
Basos: 0 %
EOS (ABSOLUTE): 0.1 10*3/uL (ref 0.0–0.4)
Eos: 2 %
Hematocrit: 42.2 % (ref 34.0–46.6)
Hemoglobin: 13.6 g/dL (ref 11.1–15.9)
Immature Grans (Abs): 0 10*3/uL (ref 0.0–0.1)
Immature Granulocytes: 0 %
Lymphocytes Absolute: 2 10*3/uL (ref 0.7–3.1)
Lymphs: 43 %
MCH: 28.3 pg (ref 26.6–33.0)
MCHC: 32.2 g/dL (ref 31.5–35.7)
MCV: 88 fL (ref 79–97)
Monocytes Absolute: 0.4 10*3/uL (ref 0.1–0.9)
Monocytes: 8 %
Neutrophils Absolute: 2.2 10*3/uL (ref 1.4–7.0)
Neutrophils: 47 %
Platelets: 235 10*3/uL (ref 150–450)
RBC: 4.81 x10E6/uL (ref 3.77–5.28)
RDW: 12.8 % (ref 11.7–15.4)
WBC: 4.6 10*3/uL (ref 3.4–10.8)

## 2022-07-13 LAB — CMP14+EGFR
ALT: 25 IU/L (ref 0–32)
AST: 27 IU/L (ref 0–40)
Albumin/Globulin Ratio: 1.9 (ref 1.2–2.2)
Albumin: 5 g/dL — ABNORMAL HIGH (ref 3.8–4.9)
Alkaline Phosphatase: 68 IU/L (ref 44–121)
BUN/Creatinine Ratio: 14 (ref 9–23)
BUN: 11 mg/dL (ref 6–24)
Bilirubin Total: 1.1 mg/dL (ref 0.0–1.2)
CO2: 22 mmol/L (ref 20–29)
Calcium: 9.8 mg/dL (ref 8.7–10.2)
Chloride: 101 mmol/L (ref 96–106)
Creatinine, Ser: 0.79 mg/dL (ref 0.57–1.00)
Globulin, Total: 2.7 g/dL (ref 1.5–4.5)
Glucose: 90 mg/dL (ref 70–99)
Potassium: 4 mmol/L (ref 3.5–5.2)
Sodium: 138 mmol/L (ref 134–144)
Total Protein: 7.7 g/dL (ref 6.0–8.5)
eGFR: 87 mL/min/{1.73_m2} (ref >59)

## 2022-07-13 LAB — LIPID PANEL WITH LDL/HDL RATIO
Cholesterol, Total: 208 mg/dL — ABNORMAL HIGH (ref 100–199)
HDL: 61 mg/dL (ref >39)
LDL Chol Calc (NIH): 115 mg/dL — ABNORMAL HIGH (ref 0–99)
LDL/HDL Ratio: 1.9 ratio (ref 0.0–3.2)
Triglycerides: 184 mg/dL — ABNORMAL HIGH (ref 0–149)
VLDL Cholesterol Cal: 32 mg/dL (ref 5–40)

## 2022-07-13 LAB — VITAMIN D 25 HYDROXY (VIT D DEFICIENCY, FRACTURES): Vit D, 25-Hydroxy: 23.7 ng/mL — ABNORMAL LOW (ref 30.0–100.0)

## 2022-07-13 LAB — TSH+FREE T4
Free T4: 1.06 ng/dL (ref 0.82–1.77)
TSH: 1.81 u[IU]/mL (ref 0.450–4.500)

## 2022-07-17 ENCOUNTER — Ambulatory Visit: Payer: Managed Care, Other (non HMO) | Admitting: Internal Medicine

## 2022-07-17 ENCOUNTER — Encounter: Payer: Self-pay | Admitting: Internal Medicine

## 2022-07-17 VITALS — BP 130/80 | HR 88 | Temp 97.8°F | Resp 16 | Ht 61.0 in | Wt 143.8 lb

## 2022-07-17 DIAGNOSIS — G4701 Insomnia due to medical condition: Secondary | ICD-10-CM

## 2022-07-17 DIAGNOSIS — Z0001 Encounter for general adult medical examination with abnormal findings: Secondary | ICD-10-CM

## 2022-07-17 DIAGNOSIS — E782 Mixed hyperlipidemia: Secondary | ICD-10-CM

## 2022-07-17 DIAGNOSIS — R300 Dysuria: Secondary | ICD-10-CM

## 2022-07-17 DIAGNOSIS — Z1239 Encounter for other screening for malignant neoplasm of breast: Secondary | ICD-10-CM | POA: Diagnosis not present

## 2022-07-17 DIAGNOSIS — J3481 Nasal mucositis (ulcerative): Secondary | ICD-10-CM

## 2022-07-17 MED ORDER — MUPIROCIN CALCIUM 2 % EX CREA: 1.0000 | TOPICAL_CREAM | Freq: Two times a day (BID) | CUTANEOUS | 0 refills | Status: DC

## 2022-07-17 NOTE — Progress Notes (Signed)
[facility] [place], [place] [address]  Internal MEDICINE  Office Visit Note  Patient Name: Linda Silva  [id number]  [id number]  Date of Service: 07/18/2022  Chief Complaint  Patient presents with   Annual Exam   Hyperlipidemia     HPI Pt is here for routine health maintenance examination Overall she feels well, she does c/o aches and pains and lack of sleep at night, it has gotten worse since her menopause Has been taking Crestor , lipid profile is better  Due for BMD but wants to hold off for now  Colonoscopy in 2015 C/o right sided nasal ulceration and scabbing, comes off and on, has been using steroids    Current Medication: Outpatient Encounter Medications as of 07/17/2022  Medication Sig   mupirocin cream (BACTROBAN) 2 % Apply 1 Application topically 2 (two) times daily.   rosuvastatin (CRESTOR) 5 MG tablet TAKE 1 TABLET BY MOUTH DAILY   No facility-administered encounter medications on file as of 07/17/2022.    Surgical History: Past Surgical History:  Procedure Laterality Date   NECK SURGERY      Medical History: Past Medical History:  Diagnosis Date   Hyperlipidemia    Vitamin D deficiency     Family History: Family History  Problem Relation Age of Onset   Breast cancer Neg Hx     Social History: Social History   Socioeconomic History   Marital status: Married    Spouse name: Not on file   Number of children: Not on file   Years of education: Not on file   Highest education level: Not on file  Occupational History   Not on file  Tobacco Use   Smoking status: Never   Smokeless tobacco: Never  Substance and Sexual Activity   Alcohol use: Never   Drug use: Never   Sexual activity: Not on file  Other Topics Concern   Not on file  Social History Narrative   Not on file   Social Determinants of Health   Financial Resource Strain: Not on file  Food Insecurity: Not on file  Transportation Needs: Not on  file  Physical Activity: Not on file  Stress: Not on file  Social Connections: Not on file      Review of Systems  Constitutional:  Negative for activity change, appetite change, chills, diaphoresis, fatigue, fever and unexpected weight change.  HENT:  Negative for congestion, ear discharge, ear pain, facial swelling, hearing loss, nosebleeds, postnasal drip, rhinorrhea, sinus pressure, sinus pain, sneezing, sore throat, tinnitus, trouble swallowing and voice change.   Eyes:  Negative for photophobia, pain, discharge, redness, itching and visual disturbance.  Respiratory:  Negative for apnea, cough, choking, chest tightness, shortness of breath, wheezing and stridor.   Cardiovascular:  Negative for chest pain, palpitations and leg swelling.  Gastrointestinal:  Negative for abdominal distention, abdominal pain, anal bleeding, constipation, diarrhea, nausea and rectal pain.  Endocrine: Negative for cold intolerance, heat intolerance, polydipsia, polyphagia and polyuria.  Genitourinary:  Negative for difficulty urinating, flank pain, frequency, genital sores, hematuria, menstrual problem, pelvic pain, urgency, vaginal bleeding, vaginal discharge and vaginal pain.  Musculoskeletal:  Negative for arthralgias, back pain, gait problem, joint swelling, myalgias and neck pain.  Skin:  Negative for color change, pallor, rash and wound.  Allergic/Immunologic: Negative for environmental allergies, food allergies and immunocompromised state.  Neurological:  Negative for dizziness, seizures, syncope, facial asymmetry, speech difficulty, weakness, light-headedness, numbness and headaches.  Hematological:  Negative for adenopathy. Does  not bruise/bleed easily.  Psychiatric/Behavioral:  Negative for agitation, behavioral problems, confusion, decreased concentration, dysphoric mood, hallucinations, self-injury, sleep disturbance and suicidal ideas. The patient is not nervous/anxious and is not hyperactive.       Vital Signs: BP 130/80   Pulse 88   Temp 97.8 F (36.6 C)   Resp 16   Ht 5' 1"  (1.549 m)   Wt 143 lb 12.8 oz (65.2 kg)   SpO2 99%   BMI 27.17 kg/m    Physical Exam Constitutional:      General: She is not in acute distress.    Appearance: She is well-developed. She is not diaphoretic.  HENT:     Head: Normocephalic and atraumatic.     Right Ear: External ear normal.     Left Ear: External ear normal.     Nose: Nose normal.     Mouth/Throat:     Pharynx: No oropharyngeal exudate.  Eyes:     General: No scleral icterus.       Right eye: No discharge.        Left eye: No discharge.     Conjunctiva/sclera: Conjunctivae normal.     Pupils: Pupils are equal, round, and reactive to light.  Neck:     Thyroid: No thyromegaly.     Vascular: No JVD.     Trachea: No tracheal deviation.  Cardiovascular:     Rate and Rhythm: Normal rate and regular rhythm.     Heart sounds: Normal heart sounds. No murmur heard.    No friction rub. No gallop.  Pulmonary:     Effort: Pulmonary effort is normal. No respiratory distress.     Breath sounds: Normal breath sounds. No stridor. No wheezing or rales.  Chest:     Chest wall: No tenderness.  Abdominal:     General: Bowel sounds are normal. There is no distension.     Palpations: Abdomen is soft. There is no mass.     Tenderness: There is no abdominal tenderness. There is no guarding or rebound.  Musculoskeletal:        General: No tenderness or deformity. Normal range of motion.     Cervical back: Normal range of motion and neck supple.  Lymphadenopathy:     Cervical: No cervical adenopathy.  Skin:    General: Skin is warm and dry.     Coloration: Skin is not pale.     Findings: No erythema or rash.  Neurological:     Mental Status: She is alert.     Cranial Nerves: No cranial nerve deficit.     Motor: No abnormal muscle tone.     Coordination: Coordination normal.     Deep Tendon Reflexes: Reflexes are normal and  symmetric.  Psychiatric:        Behavior: Behavior normal.        Thought Content: Thought content normal.        Judgment: Judgment normal.     LABS: Recent Results (from the past 2160 hour(s))  CBC with Differential/Platelet     Status: None   Collection Time: 07/12/22  7:03 AM  Result Value Ref Range   WBC 4.6 3.4 - 10.8 x10E3/uL   RBC 4.81 3.77 - 5.28 x10E6/uL   Hemoglobin 13.6 11.1 - 15.9 g/dL   Hematocrit 42.2 34.0 - 46.6 %   MCV 88 79 - 97 fL   MCH 28.3 26.6 - 33.0 pg   MCHC 32.2 31.5 - 35.7 g/dL   RDW 12.8 11.7 -  15.4 %   Platelets 235 150 - 450 x10E3/uL   Neutrophils 47 Not Estab. %   Lymphs 43 Not Estab. %   Monocytes 8 Not Estab. %   Eos 2 Not Estab. %   Basos 0 Not Estab. %   Neutrophils Absolute 2.2 1.4 - 7.0 x10E3/uL   Lymphocytes Absolute 2.0 0.7 - 3.1 x10E3/uL   Monocytes Absolute 0.4 0.1 - 0.9 x10E3/uL   EOS (ABSOLUTE) 0.1 0.0 - 0.4 x10E3/uL   Basophils Absolute 0.0 0.0 - 0.2 x10E3/uL   Immature Granulocytes 0 Not Estab. %   Immature Grans (Abs) 0.0 0.0 - 0.1 x10E3/uL  CMP14+EGFR     Status: Abnormal   Collection Time: 07/12/22  7:03 AM  Result Value Ref Range   Glucose 90 70 - 99 mg/dL   BUN 11 6 - 24 mg/dL   Creatinine, Ser 0.79 0.57 - 1.00 mg/dL   eGFR 87 >59 mL/min/1.73   BUN/Creatinine Ratio 14 9 - 23   Sodium 138 134 - 144 mmol/L   Potassium 4.0 3.5 - 5.2 mmol/L   Chloride 101 96 - 106 mmol/L   CO2 22 20 - 29 mmol/L   Calcium 9.8 8.7 - 10.2 mg/dL   Total Protein 7.7 6.0 - 8.5 g/dL   Albumin 5.0 (H) 3.8 - 4.9 g/dL   Globulin, Total 2.7 1.5 - 4.5 g/dL   Albumin/Globulin Ratio 1.9 1.2 - 2.2   Bilirubin Total 1.1 0.0 - 1.2 mg/dL   Alkaline Phosphatase 68 44 - 121 IU/L   AST 27 0 - 40 IU/L   ALT 25 0 - 32 IU/L  Lipid Panel With LDL/HDL Ratio     Status: Abnormal   Collection Time: 07/12/22  7:03 AM  Result Value Ref Range   Cholesterol, Total 208 (H) 100 - 199 mg/dL   Triglycerides 184 (H) 0 - 149 mg/dL   HDL 61 >39 mg/dL   VLDL  Cholesterol Cal 32 5 - 40 mg/dL   LDL Chol Calc (NIH) 115 (H) 0 - 99 mg/dL   LDL/HDL Ratio 1.9 0.0 - 3.2 ratio    Comment:                                     LDL/HDL Ratio                                             Men  Women                               1/2 Avg.Risk  1.0    1.5                                   Avg.Risk  3.6    3.2                                2X Avg.Risk  6.2    5.0                                3X Avg.Risk  8.0    6.1   TSH + free T4     Status: None   Collection Time: 07/12/22  7:03 AM  Result Value Ref Range   TSH 1.810 0.450 - 4.500 uIU/mL   Free T4 1.06 0.82 - 1.77 ng/dL  Vitamin D (25 hydroxy)     Status: Abnormal   Collection Time: 07/12/22  7:03 AM  Result Value Ref Range   Vit D, 25-Hydroxy 23.7 (L) 30.0 - 100.0 ng/mL    Comment: Vitamin D deficiency has been defined by the [place] and an Endocrine Society practice guideline as a level of serum 25-OH vitamin D less than 20 ng/mL (1,2). The Endocrine Society went on to further define vitamin D insufficiency as a level between 21 and 29 ng/mL (2). 1. IOM (Institute of Medicine). 2010. Dietary reference    intakes for calcium and D. [place]: The    [employer]. 2. Holick MF, Binkley [place], Bischoff-Ferrari HA, et al.    Evaluation, treatment, and prevention of vitamin D    deficiency: an Endocrine Society clinical practice    guideline. JCEM. 2011 Jul; 96(7):1911-30.   UA/M w/rflx Culture, Routine     Status: None   Collection Time: 07/17/22  4:17 PM   Specimen: Urine   Urine  Result Value Ref Range   Specific Gravity, UA 1.006 1.005 - 1.030   pH, UA 7.5 5.0 - 7.5   Color, UA Yellow Yellow   Appearance Ur Clear Clear   Leukocytes,UA Negative Negative   Protein,UA Negative Negative/Trace   Glucose, UA Negative Negative   Ketones, UA Negative Negative   RBC, UA Negative Negative   Bilirubin, UA Negative Negative   Urobilinogen, Ur 0.2 0.2 - 1.0 mg/dL   Nitrite,  UA Negative Negative   Microscopic Examination Comment     Comment: Microscopic follows if indicated.   Microscopic Examination See below:     Comment: Microscopic was indicated and was performed.   Urinalysis Reflex Comment     Comment: This specimen will not reflex to a Urine Culture.  Microscopic Examination     Status: None   Collection Time: 07/17/22  4:17 PM   Urine  Result Value Ref Range   WBC, UA None seen 0 - 5 /hpf   RBC, Urine None seen 0 - 2 /hpf   Epithelial Cells (non renal) None seen 0 - 10 /hpf   Casts None seen None seen /lpf   Bacteria, UA None seen None seen/Few      Assessment/Plan: 1. Encounter for routine adult health examination with abnormal findings Update on all preventive hm, will need to schedule BMD in future, Colonoscopy due in 2025  2. Mixed hyperlipidemia Continue Crestor as before   3. Breast screening - MM 3D SCREEN BREAST BILATERAL; Future  4. Dysuria - UA/M w/rflx Culture, Routine  5. Insomnia due to medical condition Pt is willing to take Melatonin over the counter    6. Nasal mucositis (ulcerative) She is instructed to stop steroids and use Bactroban, call the office if no improvement is seen- mupirocin cream (BACTROBAN) 2 %; Apply 1 Application topically 2 (two) times daily.  Dispense: 15 g; Refill: 0    General Counseling: Nakeisha verbalizes understanding of the findings of todays visit and agrees with plan of treatment. I have discussed any further diagnostic evaluation that may be needed or ordered today. We also reviewed her medications today. she has been encouraged to call the office with any  questions or concerns that should arise related to todays visit.    Counseling:  [place] Controlled Substance Database was reviewed by me.  Orders Placed This Encounter  Procedures   Microscopic Examination   MM 3D SCREEN BREAST BILATERAL   UA/M w/rflx Culture, Routine    Meds ordered this encounter  Medications   mupirocin cream  (BACTROBAN) 2 %    Sig: Apply 1 Application topically 2 (two) times daily.    Dispense:  15 g    Refill:  0    Total time spent:35 Minutes  Time spent includes review of chart, medications, test results, and follow up plan with the patient.     Lavera Guise, MD  Internal Medicine

## 2022-07-18 LAB — UA/M W/RFLX CULTURE, ROUTINE
Bilirubin, UA: NEGATIVE
Glucose, UA: NEGATIVE
Ketones, UA: NEGATIVE
Leukocytes,UA: NEGATIVE
Nitrite, UA: NEGATIVE
Protein,UA: NEGATIVE
RBC, UA: NEGATIVE
Specific Gravity, UA: 1.006 (ref 1.005–1.030)
Urobilinogen, Ur: 0.2 mg/dL (ref 0.2–1.0)
pH, UA: 7.5 (ref 5.0–7.5)

## 2022-07-18 LAB — MICROSCOPIC EXAMINATION
Bacteria, UA: NONE SEEN
Casts: NONE SEEN /lpf
Epithelial Cells (non renal): NONE SEEN /hpf (ref 0–10)
RBC, Urine: NONE SEEN /hpf (ref 0–2)
WBC, UA: NONE SEEN /hpf (ref 0–5)

## 2022-08-11 ENCOUNTER — Other Ambulatory Visit: Payer: Self-pay | Admitting: Internal Medicine

## 2022-08-11 DIAGNOSIS — Z0000 Encounter for general adult medical examination without abnormal findings: Secondary | ICD-10-CM

## 2022-08-11 DIAGNOSIS — E782 Mixed hyperlipidemia: Secondary | ICD-10-CM

## 2022-08-11 DIAGNOSIS — Z124 Encounter for screening for malignant neoplasm of cervix: Secondary | ICD-10-CM

## 2022-08-11 DIAGNOSIS — R102 Pelvic and perineal pain: Secondary | ICD-10-CM

## 2022-08-11 DIAGNOSIS — Z1231 Encounter for screening mammogram for malignant neoplasm of breast: Secondary | ICD-10-CM

## 2022-08-11 DIAGNOSIS — R300 Dysuria: Secondary | ICD-10-CM

## 2022-08-13 ENCOUNTER — Other Ambulatory Visit: Payer: Self-pay

## 2022-08-13 DIAGNOSIS — R300 Dysuria: Secondary | ICD-10-CM

## 2022-08-13 DIAGNOSIS — R102 Pelvic and perineal pain: Secondary | ICD-10-CM

## 2022-08-13 DIAGNOSIS — Z1231 Encounter for screening mammogram for malignant neoplasm of breast: Secondary | ICD-10-CM

## 2022-08-13 DIAGNOSIS — Z0000 Encounter for general adult medical examination without abnormal findings: Secondary | ICD-10-CM

## 2022-08-13 DIAGNOSIS — Z124 Encounter for screening for malignant neoplasm of cervix: Secondary | ICD-10-CM

## 2022-08-13 DIAGNOSIS — E782 Mixed hyperlipidemia: Secondary | ICD-10-CM

## 2022-08-13 MED ORDER — ROSUVASTATIN CALCIUM 5 MG PO TABS: 5.0000 mg | ORAL_TABLET | Freq: Every day | ORAL | 1 refills | Status: DC

## 2022-09-04 ENCOUNTER — Ambulatory Visit
Admission: RE | Admit: 2022-09-04 | Discharge: 2022-09-04 | Disposition: A | Discharge status: Home / Self Care | Payer: Managed Care, Other (non HMO) | Source: Ambulatory Visit | Attending: Internal Medicine | Admitting: Internal Medicine

## 2022-09-04 DIAGNOSIS — Z1239 Encounter for other screening for malignant neoplasm of breast: Secondary | ICD-10-CM | POA: Diagnosis present

## 2022-09-04 DIAGNOSIS — Z1231 Encounter for screening mammogram for malignant neoplasm of breast: Secondary | ICD-10-CM | POA: Diagnosis not present

## 2022-09-04 DIAGNOSIS — J3481 Nasal mucositis (ulcerative): Secondary | ICD-10-CM

## 2022-09-04 DIAGNOSIS — Z0001 Encounter for general adult medical examination with abnormal findings: Secondary | ICD-10-CM

## 2022-09-04 DIAGNOSIS — R300 Dysuria: Secondary | ICD-10-CM

## 2022-09-04 DIAGNOSIS — E782 Mixed hyperlipidemia: Secondary | ICD-10-CM

## 2022-09-04 DIAGNOSIS — G4701 Insomnia due to medical condition: Secondary | ICD-10-CM

## 2023-02-10 ENCOUNTER — Other Ambulatory Visit: Payer: Self-pay | Admitting: Internal Medicine

## 2023-02-10 DIAGNOSIS — R300 Dysuria: Secondary | ICD-10-CM

## 2023-02-10 DIAGNOSIS — Z0000 Encounter for general adult medical examination without abnormal findings: Secondary | ICD-10-CM

## 2023-02-10 DIAGNOSIS — E782 Mixed hyperlipidemia: Secondary | ICD-10-CM

## 2023-02-10 DIAGNOSIS — Z124 Encounter for screening for malignant neoplasm of cervix: Secondary | ICD-10-CM

## 2023-02-10 DIAGNOSIS — R102 Pelvic and perineal pain: Secondary | ICD-10-CM

## 2023-02-10 DIAGNOSIS — Z1231 Encounter for screening mammogram for malignant neoplasm of breast: Secondary | ICD-10-CM

## 2023-03-11 ENCOUNTER — Other Ambulatory Visit: Payer: Self-pay | Admitting: Internal Medicine

## 2023-03-12 LAB — COMPREHENSIVE METABOLIC PANEL
ALT: 29 IU/L (ref 0–32)
AST: 26 IU/L (ref 0–40)
Albumin/Globulin Ratio: 1.8 (ref 1.2–2.2)
Albumin: 4.4 g/dL (ref 3.8–4.9)
Alkaline Phosphatase: 60 IU/L (ref 44–121)
BUN/Creatinine Ratio: 20 (ref 9–23)
BUN: 13 mg/dL (ref 6–24)
Bilirubin Total: 0.8 mg/dL (ref 0.0–1.2)
CO2: 22 mmol/L (ref 20–29)
Calcium: 9.4 mg/dL (ref 8.7–10.2)
Chloride: 106 mmol/L (ref 96–106)
Creatinine, Ser: 0.64 mg/dL (ref 0.57–1.00)
Globulin, Total: 2.4 g/dL (ref 1.5–4.5)
Glucose: 90 mg/dL (ref 70–99)
Potassium: 4.1 mmol/L (ref 3.5–5.2)
Sodium: 139 mmol/L (ref 134–144)
Total Protein: 6.8 g/dL (ref 6.0–8.5)
eGFR: 102 mL/min/{1.73_m2} (ref >59)

## 2023-03-12 LAB — B12 AND FOLATE PANEL
Folate: >20 ng/mL (ref >3.0)
Vitamin B-12: 940 pg/mL (ref 232–1245)

## 2023-03-12 LAB — CBC WITH DIFFERENTIAL/PLATELET
Basophils Absolute: 0 10*3/uL (ref 0.0–0.2)
Basos: 1 %
EOS (ABSOLUTE): 0.1 10*3/uL (ref 0.0–0.4)
Eos: 2 %
Hematocrit: 38.8 % (ref 34.0–46.6)
Hemoglobin: 12.6 g/dL (ref 11.1–15.9)
Immature Grans (Abs): 0 10*3/uL (ref 0.0–0.1)
Immature Granulocytes: 0 %
Lymphocytes Absolute: 2 10*3/uL (ref 0.7–3.1)
Lymphs: 49 %
MCH: 27.9 pg (ref 26.6–33.0)
MCHC: 32.5 g/dL (ref 31.5–35.7)
MCV: 86 fL (ref 79–97)
Monocytes Absolute: 0.3 10*3/uL (ref 0.1–0.9)
Monocytes: 8 %
Neutrophils Absolute: 1.6 10*3/uL (ref 1.4–7.0)
Neutrophils: 40 %
Platelets: 218 10*3/uL (ref 150–450)
RBC: 4.51 x10E6/uL (ref 3.77–5.28)
RDW: 12.8 % (ref 11.7–15.4)
WBC: 4 10*3/uL (ref 3.4–10.8)

## 2023-03-12 LAB — VITAMIN D 25 HYDROXY (VIT D DEFICIENCY, FRACTURES): Vit D, 25-Hydroxy: 22.3 ng/mL — ABNORMAL LOW (ref 30.0–100.0)

## 2023-03-12 LAB — FERRITIN: Ferritin: 65 ng/mL (ref 15–150)

## 2023-03-12 LAB — T4, FREE: Free T4: 0.95 ng/dL (ref 0.82–1.77)

## 2023-03-12 LAB — LP+NON-HDL CHOLESTEROL
Cholesterol, Total: 171 mg/dL (ref 100–199)
HDL: 57 mg/dL (ref >39)
LDL Chol Calc (NIH): 90 mg/dL (ref 0–99)
Total Non-HDL-Chol (LDL+VLDL): 114 mg/dL (ref 0–129)
Triglycerides: 136 mg/dL (ref 0–149)
VLDL Cholesterol Cal: 24 mg/dL (ref 5–40)

## 2023-03-12 LAB — TSH: TSH: 2.45 u[IU]/mL (ref 0.450–4.500)

## 2023-03-28 ENCOUNTER — Telehealth: Payer: Self-pay | Admitting: Internal Medicine

## 2023-03-28 NOTE — Telephone Encounter (Signed)
Per patient's request, 03/11/23 lab results mailed to her-Linda Silva

## 2023-07-22 ENCOUNTER — Encounter: Payer: Managed Care, Other (non HMO) | Admitting: Internal Medicine

## 2023-07-23 ENCOUNTER — Encounter: Payer: Self-pay | Admitting: Internal Medicine

## 2023-07-23 ENCOUNTER — Ambulatory Visit (INDEPENDENT_AMBULATORY_CARE_PROVIDER_SITE_OTHER): Payer: Managed Care, Other (non HMO) | Admitting: Internal Medicine

## 2023-07-23 VITALS — BP 120/85 | HR 82 | Temp 98.5°F | Resp 16 | Ht 61.5 in | Wt 147.8 lb

## 2023-07-23 DIAGNOSIS — E782 Mixed hyperlipidemia: Secondary | ICD-10-CM | POA: Diagnosis not present

## 2023-07-23 DIAGNOSIS — E2839 Other primary ovarian failure: Secondary | ICD-10-CM | POA: Diagnosis not present

## 2023-07-23 DIAGNOSIS — Z0000 Encounter for general adult medical examination without abnormal findings: Secondary | ICD-10-CM | POA: Diagnosis not present

## 2023-07-23 DIAGNOSIS — Z1231 Encounter for screening mammogram for malignant neoplasm of breast: Secondary | ICD-10-CM | POA: Diagnosis not present

## 2023-07-23 DIAGNOSIS — Z1211 Encounter for screening for malignant neoplasm of colon: Secondary | ICD-10-CM

## 2023-07-23 DIAGNOSIS — R300 Dysuria: Secondary | ICD-10-CM

## 2023-07-23 MED ORDER — ROSUVASTATIN CALCIUM 5 MG PO TABS: 5.0000 mg | ORAL_TABLET | Freq: Every day | ORAL | 1 refills | Status: DC

## 2023-07-23 NOTE — Progress Notes (Signed)
[facility] [address] [place], [place] [address]  Internal MEDICINE  Office Visit Note  Patient Name: Linda Silva  [id number]  [id number]  Date of Service: 08/06/2023  Chief Complaint  Patient presents with   Annual Exam   Hyperlipidemia     HPI Pt is here for routine health maintenance examination  Denies any major complaints  Takes all meds as prescribed  Will be due for colonoscopy and mammogram  BMD will be ordered as well   Current Medication: Outpatient Encounter Medications as of 07/23/2023  Medication Sig   [DISCONTINUED] mupirocin cream (BACTROBAN) 2 % Apply 1 Application topically 2 (two) times daily.   [DISCONTINUED] rosuvastatin (CRESTOR) 5 MG tablet TAKE 1 TABLET(5 MG) BY MOUTH DAILY   rosuvastatin (CRESTOR) 5 MG tablet Take 1 tablet (5 mg total) by mouth daily.   No facility-administered encounter medications on file as of 07/23/2023.    Surgical History: Past Surgical History:  Procedure Laterality Date   NECK SURGERY      Medical History: Past Medical History:  Diagnosis Date   Hyperlipidemia    Vitamin D deficiency     Family History: Family History  Problem Relation Age of Onset   Breast cancer Neg Hx     Social History: Social History   Socioeconomic History   Marital status: Married    Spouse name: Not on file   Number of children: Not on file   Years of education: Not on file   Highest education level: Not on file  Occupational History   Not on file  Tobacco Use   Smoking status: Never   Smokeless tobacco: Never  Substance and Sexual Activity   Alcohol use: Never   Drug use: Never   Sexual activity: Not on file  Other Topics Concern   Not on file  Social History Narrative   Not on file   Social Determinants of Health   Financial Resource Strain: Not on file  Food Insecurity: Not on file  Transportation Needs: Not on file  Physical Activity: Not on file  Stress: Not on file  Social Connections:  Not on file      Review of Systems  Constitutional:  Negative for chills, fatigue and unexpected weight change.  HENT:  Positive for postnasal drip. Negative for congestion, rhinorrhea, sneezing and sore throat.   Eyes:  Negative for redness.  Respiratory:  Negative for cough, chest tightness and shortness of breath.   Cardiovascular:  Negative for chest pain and palpitations.  Gastrointestinal:  Negative for abdominal pain, constipation, diarrhea, nausea and vomiting.  Genitourinary:  Negative for dysuria and frequency.  Musculoskeletal:  Negative for arthralgias, back pain, joint swelling and neck pain.  Skin:  Negative for rash.  Neurological: Negative.  Negative for tremors and numbness.  Hematological:  Negative for adenopathy. Does not bruise/bleed easily.  Psychiatric/Behavioral:  Negative for behavioral problems (Depression), sleep disturbance and suicidal ideas. The patient is not nervous/anxious.      Vital Signs: BP 120/85   Pulse 82   Temp 98.5 F (36.9 C)   Resp 16   Ht 5' 1.5" (1.562 m)   Wt 147 lb 12.8 oz (67 kg)   SpO2 99%   BMI 27.47 kg/m   Physical Exam Constitutional:      General: She is not in acute distress.    Appearance: She is well-developed. She is not diaphoretic.  HENT:     Head: Normocephalic and atraumatic.     Mouth/Throat:  Mouth: Oropharynx is clear and moist.     Pharynx: No oropharyngeal exudate.  Eyes:     Extraocular Movements: EOM normal.     Pupils: Pupils are equal, round, and reactive to light.  Neck:     Thyroid: No thyromegaly.     Vascular: No JVD.     Trachea: No tracheal deviation.  Cardiovascular:     Rate and Rhythm: Normal rate and regular rhythm.     Heart sounds: Normal heart sounds. No murmur heard.    No friction rub. No gallop.  Pulmonary:     Effort: Pulmonary effort is normal. No respiratory distress.     Breath sounds: No wheezing or rales.  Chest:     Chest wall: No tenderness.  Abdominal:      General: Bowel sounds are normal.     Palpations: Abdomen is soft.  Musculoskeletal:        General: Normal range of motion.     Cervical back: Normal range of motion and neck supple.  Lymphadenopathy:     Cervical: No cervical adenopathy.  Skin:    General: Skin is warm and dry.  Neurological:     Mental Status: She is alert and oriented to person, place, and time.     Cranial Nerves: No cranial nerve deficit.  Psychiatric:        Mood and Affect: Mood and affect normal.        Behavior: Behavior normal.        Thought Content: Thought content normal.        Judgment: Judgment normal.      Assessment/Plan: 1. Encounter for general adult medical examination w/o abnormal findings Pt is current on PHM  2. Other primary ovarian failure No HRT, will continue to take calcium and vit d - DG Bone Density; Future  3. Mixed hyperlipidemia - rosuvastatin (CRESTOR) 5 MG tablet; Take 1 tablet (5 mg total) by mouth daily.  Dispense: 90 tablet; Refill: 1  4. Encounter for mammogram to establish baseline mammogram - MM 3D DIAGNOSTIC MAMMOGRAM BILATERAL BREAST; Future   5. Encounter for screening colonoscopy -ambulatory referral to Gastroenterology  6. Dysuria - UA/M w/rflx Culture, Routine - rosuvastatin (CRESTOR) 5 MG tablet; Take 1 tablet (5 mg total) by mouth daily.  Dispense: 90 tablet; Refill: 1 - Microscopic Examination   General Counseling: Theressa verbalizes understanding of the findings of todays visit and agrees with plan of treatment. I have discussed any further diagnostic evaluation that may be needed or ordered today. We also reviewed her medications today. she has been encouraged to call the office with any questions or concerns that should arise related to todays visit. Pt requested hand written lab order since she is an employee at [employer]    Counseling:  [place] Controlled Substance Database was reviewed by me.  Orders Placed This Encounter  Procedures    Microscopic Examination   MM 3D DIAGNOSTIC MAMMOGRAM BILATERAL BREAST   DG Bone Density   UA/M w/rflx Culture, Routine   Ambulatory referral to Gastroenterology    Meds ordered this encounter  Medications   rosuvastatin (CRESTOR) 5 MG tablet    Sig: Take 1 tablet (5 mg total) by mouth daily.    Dispense:  90 tablet    Refill:  1    Total time spent 35 Minutes  Time spent includes review of chart, medications, test results, and follow up plan with the patient.     Lyndon Code, MD  Internal  Medicine

## 2023-07-24 LAB — UA/M W/RFLX CULTURE, ROUTINE
Bilirubin, UA: NEGATIVE
Glucose, UA: NEGATIVE
Ketones, UA: NEGATIVE
Leukocytes,UA: NEGATIVE
Nitrite, UA: NEGATIVE
Protein,UA: NEGATIVE
RBC, UA: NEGATIVE
Specific Gravity, UA: 1.006 (ref 1.005–1.030)
Urobilinogen, Ur: 0.2 mg/dL (ref 0.2–1.0)
pH, UA: 7 (ref 5.0–7.5)

## 2023-07-24 LAB — MICROSCOPIC EXAMINATION
Bacteria, UA: NONE SEEN
Casts: NONE SEEN /LPF
Epithelial Cells (non renal): NONE SEEN /HPF (ref 0–10)
RBC, Urine: NONE SEEN /hpf (ref 0–2)
WBC, UA: NONE SEEN /HPF (ref 0–5)

## 2023-08-23 ENCOUNTER — Telehealth: Payer: Self-pay

## 2023-08-23 ENCOUNTER — Other Ambulatory Visit: Payer: Self-pay | Admitting: Internal Medicine

## 2023-08-23 DIAGNOSIS — E782 Mixed hyperlipidemia: Secondary | ICD-10-CM

## 2023-08-23 DIAGNOSIS — Z1231 Encounter for screening mammogram for malignant neoplasm of breast: Secondary | ICD-10-CM

## 2023-08-23 DIAGNOSIS — Z0000 Encounter for general adult medical examination without abnormal findings: Secondary | ICD-10-CM

## 2023-08-23 DIAGNOSIS — R300 Dysuria: Secondary | ICD-10-CM

## 2023-08-23 NOTE — Telephone Encounter (Signed)
Patient states she having NO upper GI symptoms and does not need a EGD done. She states she only needs a colonoscopy but wants to call her insurance before scheduling the procedure. Gave her the DX codes and CPT codes and she will call us back if she wants to schedule the procedure.

## 2023-09-17 ENCOUNTER — Ambulatory Visit
Admission: RE | Admit: 2023-09-17 | Discharge: 2023-09-17 | Disposition: A | Discharge status: Home / Self Care | Payer: Managed Care, Other (non HMO) | Source: Ambulatory Visit | Attending: Internal Medicine | Admitting: Internal Medicine

## 2023-09-17 ENCOUNTER — Other Ambulatory Visit: Payer: Managed Care, Other (non HMO)

## 2023-09-17 DIAGNOSIS — Z1231 Encounter for screening mammogram for malignant neoplasm of breast: Secondary | ICD-10-CM | POA: Diagnosis present

## 2023-09-30 ENCOUNTER — Telehealth: Payer: Self-pay

## 2023-10-02 ENCOUNTER — Other Ambulatory Visit: Payer: Self-pay | Admitting: Internal Medicine

## 2023-10-02 DIAGNOSIS — Z1211 Encounter for screening for malignant neoplasm of colon: Secondary | ICD-10-CM

## 2023-10-03 NOTE — Telephone Encounter (Signed)
Pt advised we placed referral for colonoscopy

## 2023-10-04 ENCOUNTER — Other Ambulatory Visit: Payer: Self-pay

## 2023-10-04 ENCOUNTER — Telehealth: Payer: Self-pay

## 2023-10-04 DIAGNOSIS — Z1211 Encounter for screening for malignant neoplasm of colon: Secondary | ICD-10-CM

## 2023-10-04 MED ORDER — NA SULFATE-K SULFATE-MG SULF 17.5-3.13-1.6 GM/177ML PO SOLN: 1.0000 | Freq: Once | ORAL | 0 refills | Status: AC

## 2023-10-04 NOTE — Telephone Encounter (Signed)
Gastroenterology Pre-Procedure Review  Request Date: 11/08/23 Requesting Physician: Dr. Allegra Lai  PATIENT REVIEW QUESTIONS: The patient responded to the following health history questions as indicated:    1. Are you having any GI issues? no 2. Do you have a personal history of Polyps? no 3. Do you have a family history of Colon Cancer or Polyps? no 4. Diabetes Mellitus? no 5. Joint replacements in the past 12 months?no 6. Major health problems in the past 3 months?no 7. Any artificial heart valves, MVP, or defibrillator?no    MEDICATIONS & ALLERGIES:    Patient reports the following regarding taking any anticoagulation/antiplatelet therapy:   Plavix, Coumadin, Eliquis, Xarelto, Lovenox, Pradaxa, Brilinta, or Effient? no Aspirin? no  Patient confirms/reports the following medications:  Current Outpatient Medications  Medication Sig Dispense Refill   rosuvastatin (CRESTOR) 5 MG tablet TAKE 1 TABLET(5 MG) BY MOUTH DAILY 90 tablet 1   No current facility-administered medications for this visit.    Patient confirms/reports the following allergies:  No Known Allergies  No orders of the defined types were placed in this encounter.   AUTHORIZATION INFORMATION Primary Insurance: 1D#: Group #:  Secondary Insurance: 1D#: Group #:  SCHEDULE INFORMATION: Date: 11/08/23 Time: Location: [facility]

## 2023-10-05 ENCOUNTER — Other Ambulatory Visit: Payer: Self-pay | Admitting: Internal Medicine

## 2023-10-05 DIAGNOSIS — Z0000 Encounter for general adult medical examination without abnormal findings: Secondary | ICD-10-CM

## 2023-10-05 DIAGNOSIS — Z1231 Encounter for screening mammogram for malignant neoplasm of breast: Secondary | ICD-10-CM

## 2023-10-05 DIAGNOSIS — E782 Mixed hyperlipidemia: Secondary | ICD-10-CM

## 2023-10-05 DIAGNOSIS — R300 Dysuria: Secondary | ICD-10-CM

## 2023-11-01 ENCOUNTER — Encounter: Payer: Self-pay | Admitting: Gastroenterology

## 2023-11-08 ENCOUNTER — Ambulatory Visit: Payer: Managed Care, Other (non HMO) | Admitting: Certified Registered"

## 2023-11-08 ENCOUNTER — Encounter: Payer: Self-pay | Admitting: Gastroenterology

## 2023-11-08 ENCOUNTER — Other Ambulatory Visit: Payer: Self-pay

## 2023-11-08 ENCOUNTER — Encounter
Admission: RE | Disposition: A | Discharge status: Home / Self Care | Payer: Self-pay | Source: Home / Self Care | Attending: Gastroenterology

## 2023-11-08 ENCOUNTER — Ambulatory Visit
Admission: RE | Admit: 2023-11-08 | Discharge: 2023-11-08 | Disposition: A | Discharge status: Home / Self Care | Payer: Managed Care, Other (non HMO) | Attending: Gastroenterology | Admitting: Gastroenterology

## 2023-11-08 DIAGNOSIS — Z1211 Encounter for screening for malignant neoplasm of colon: Secondary | ICD-10-CM

## 2023-11-08 DIAGNOSIS — D120 Benign neoplasm of cecum: Secondary | ICD-10-CM | POA: Diagnosis not present

## 2023-11-08 DIAGNOSIS — K635 Polyp of colon: Secondary | ICD-10-CM

## 2023-11-08 HISTORY — PX: POLYPECTOMY: SHX5525

## 2023-11-08 HISTORY — PX: COLONOSCOPY WITH PROPOFOL: SHX5780

## 2023-11-08 SURGERY — COLONOSCOPY WITH PROPOFOL
Anesthesia: General

## 2023-11-08 MED ORDER — PROPOFOL 10 MG/ML IV BOLUS
INTRAVENOUS | Status: DC | PRN
  Administered 2023-11-08: 30 mg via INTRAVENOUS

## 2023-11-08 MED ORDER — LIDOCAINE HCL (CARDIAC) PF 100 MG/5ML IV SOSY
PREFILLED_SYRINGE | INTRAVENOUS | Status: DC | PRN
  Administered 2023-11-08: 100 mg via INTRAVENOUS

## 2023-11-08 MED ORDER — PROPOFOL 500 MG/50ML IV EMUL
INTRAVENOUS | Status: DC | PRN
  Administered 2023-11-08: 150 ug/kg/min via INTRAVENOUS

## 2023-11-08 MED ORDER — SODIUM CHLORIDE 0.9 % IV SOLN: INTRAVENOUS | Status: DC

## 2023-11-08 NOTE — Op Note (Signed)
[facility] Gastroenterology Patient Name: Linda Silva Procedure Date: 11/08/2023 8:58 AM MRN: [id number] Account #: [id number] Date of Birth: 05/08/1966 Admit Type: Outpatient Age: 57 Room[facility] ENDO ROOM 2 Gender: Female Note Status: Finalized Instrument Name: Peds Colonoscope [id number] Procedure:             Colonoscopy Indications:           Screening for colorectal malignant neoplasm Providers:             Toney Reil MD, MD Medicines:             General Anesthesia Complications:         No immediate complications. Estimated blood loss: None. Procedure:             Pre-Anesthesia Assessment:                        - Prior to the procedure, a History and Physical was                         performed, and patient medications and allergies were                         reviewed. The patient is competent. The risks and                         benefits of the procedure and the sedation options and                         risks were discussed with the patient. All questions                         were answered and informed consent was obtained.                         Patient identification and proposed procedure were                         verified by the physician, the nurse, the                         anesthesiologist, the anesthetist and the technician                         in the pre-procedure area in the procedure room in the                         endoscopy suite. Mental Status Examination: alert and                         oriented. Airway Examination: normal oropharyngeal                         airway and neck mobility. Respiratory Examination:                         clear to auscultation. CV Examination: normal.                         Prophylactic Antibiotics:  The patient does not require                         prophylactic antibiotics. Prior Anticoagulants: The                         patient has taken no anticoagulant or antiplatelet                          agents. ASA Grade Assessment: II - A patient with mild                         systemic disease. After reviewing the risks and                         benefits, the patient was deemed in satisfactory                         condition to undergo the procedure. The anesthesia                         plan was to use general anesthesia. Immediately prior                         to administration of medications, the patient was                         re-assessed for adequacy to receive sedatives. The                         heart rate, respiratory rate, oxygen saturations,                         blood pressure, adequacy of pulmonary ventilation, and                         response to care were monitored throughout the                         procedure. The physical status of the patient was                         re-assessed after the procedure.                        After obtaining informed consent, the colonoscope was                         passed under direct vision. Throughout the procedure,                         the patient's blood pressure, pulse, and oxygen                         saturations were monitored continuously. The                         Colonoscope was introduced through the anus and  advanced to the the cecum, identified by appendiceal                         orifice and ileocecal valve. The colonoscopy was                         performed without difficulty. The patient tolerated                         the procedure well. The quality of the bowel                         preparation was evaluated using the BBPS [facility] Bowel                         Preparation Scale) with scores of: Right Colon = 3,                         Transverse Colon = 3 and Left Colon = 3 (entire mucosa                         seen well with no residual staining, small fragments                         of stool or opaque liquid). The total BBPS  score                         equals 9. The ileocecal valve, appendiceal orifice,                         and rectum were photographed. Findings:      The perianal and digital rectal examinations were normal. Pertinent       negatives include normal sphincter tone and no palpable rectal lesions.      A 5 mm polyp was found in the ascending colon. The polyp was sessile.       The polyp was removed with a cold snare. Resection and retrieval were       complete. Estimated blood loss: none.      The retroflexed view of the distal rectum and anal verge was normal and       showed no anal or rectal abnormalities. Impression:            - One 5 mm polyp in the ascending colon, removed with                         a cold snare. Resected and retrieved.                        - The distal rectum and anal verge are normal on                         retroflexion view. Recommendation:        - Discharge patient to home (with escort).                        - Resume previous diet today.                        -  Continue present medications.                        - Await pathology results.                        - Repeat colonoscopy in 5 years for surveillance. Procedure Code(s):     --- Professional ---                        [phone], Colonoscopy, flexible; with removal of                         tumor(s), polyp(s), or other lesion(s) by snare                         technique Diagnosis Code(s):     --- Professional ---                        Z12.11, Encounter for screening for malignant neoplasm                         of colon                        D12.2, Benign neoplasm of ascending colon CPT copyright 2022 American Medical Association. All rights reserved. The codes documented in this report are preliminary and upon coder review may  be revised to meet current compliance requirements. Dr. Libby Maw Toney Reil MD, MD 11/08/2023 9:29:08 AM This report has been signed  electronically. Number of Addenda: 0 Note Initiated On: 11/08/2023 8:58 AM Scope Withdrawal Time: 0 hours 11 minutes 5 seconds  Total Procedure Duration: 0 hours 14 minutes 56 seconds  Estimated Blood Loss:  Estimated blood loss: none.      [facility]

## 2023-11-08 NOTE — H&P (Signed)
  Arlyss Repress, MD [address]  [address]  [place], [place] [address]  Main: [phone]  Fax: [phone] Pager: [phone]  Primary Care Physician:  Lyndon Code, MD Primary Gastroenterologist:  Dr. Arlyss Repress  Pre-Procedure History & Physical: HPI:  Linda Silva is a 57 y.o. female is here for an colonoscopy.   Past Medical History:  Diagnosis Date   Hyperlipidemia    Vitamin D deficiency     Past Surgical History:  Procedure Laterality Date   NECK SURGERY      Prior to Admission medications   Medication Sig Start Date End Date Taking? Authorizing Provider  rosuvastatin (CRESTOR) 5 MG tablet TAKE 1 TABLET BY MOUTH DAILY 10/07/23  Yes Lyndon Code, MD    Allergies as of 10/04/2023   (No Known Allergies)    Family History  Problem Relation Age of Onset   Breast cancer Neg Hx     Social History   Socioeconomic History   Marital status: Married    Spouse name: Not on file   Number of children: Not on file   Years of education: Not on file   Highest education level: Not on file  Occupational History   Not on file  Tobacco Use   Smoking status: Never   Smokeless tobacco: Never  Vaping Use   Vaping status: Never Used  Substance and Sexual Activity   Alcohol use: Never   Drug use: Never   Sexual activity: Not on file  Other Topics Concern   Not on file  Social History Narrative   Not on file   Social Drivers of Health   Financial Resource Strain: Not on file  Food Insecurity: Not on file  Transportation Needs: Not on file  Physical Activity: Not on file  Stress: Not on file  Social Connections: Not on file  Intimate Partner Violence: Not on file    Review of Systems: See HPI, otherwise negative ROS  Physical Exam: BP (!) 146/87   Pulse 86   Temp (!) 97.2 F (36.2 C) (Temporal)   Resp 16   Ht 5\' 2"  (1.575 m)   Wt 64 kg   SpO2 99%   BMI 25.79 kg/m  General:   Alert,  pleasant and cooperative in NAD Head:   Normocephalic and atraumatic. Neck:  Supple; no masses or thyromegaly. Lungs:  Clear throughout to auscultation.    Heart:  Regular rate and rhythm. Abdomen:  Soft, nontender and nondistended. Normal bowel sounds, without guarding, and without rebound.   Neurologic:  Alert and  oriented x4;  grossly normal neurologically.  Impression/Plan: Linda Silva is here for a colonoscopy to be performed for colon cancer screening  Risks, benefits, limitations, and alternatives regarding  colonoscopy have been reviewed with the patient.  Questions have been answered.  All parties agreeable.   Lannette Donath, MD  11/08/2023, 9:00 AM

## 2023-11-08 NOTE — Anesthesia Preprocedure Evaluation (Addendum)
Anesthesia Evaluation  Patient identified by MRN, date of birth, ID band Patient awake    Reviewed: Allergy & Precautions, NPO status , Patient's Chart, lab work & pertinent test results  History of Anesthesia Complications Negative for: history of anesthetic complications  Airway Mallampati: IV  TM Distance: >3 FB Neck ROM: full    Dental no notable dental hx.    Pulmonary neg pulmonary ROS   Pulmonary exam normal        Cardiovascular negative cardio ROS Normal cardiovascular exam     Neuro/Psych negative neurological ROS  negative psych ROS   GI/Hepatic negative GI ROS, Neg liver ROS,,,  Endo/Other  negative endocrine ROS    Renal/GU negative Renal ROS  negative genitourinary   Musculoskeletal   Abdominal   Peds  Hematology negative hematology ROS (+)   Anesthesia Other Findings Past Medical History: No date: Hyperlipidemia No date: Vitamin D deficiency  Past Surgical History: No date: NECK SURGERY     Reproductive/Obstetrics negative OB ROS                             Anesthesia Physical Anesthesia Plan  ASA: 2  Anesthesia Plan: General   Post-op Pain Management: Minimal or no pain anticipated   Induction: Intravenous  PONV Risk Score and Plan: 2 and Propofol infusion and TIVA  Airway Management Planned: Natural Airway and Nasal Cannula  Additional Equipment:   Intra-op Plan:   Post-operative Plan:   Informed Consent: I have reviewed the patients History and Physical, chart, labs and discussed the procedure including the risks, benefits and alternatives for the proposed anesthesia with the patient or authorized representative who has indicated his/her understanding and acceptance.     Dental Advisory Given  Plan Discussed with: Anesthesiologist, CRNA and Surgeon  Anesthesia Plan Comments: (Patient consented for risks of anesthesia including but not limited  to:  - adverse reactions to medications - risk of airway placement if required - damage to eyes, teeth, lips or other oral mucosa - nerve damage due to positioning  - sore throat or hoarseness - Damage to heart, brain, nerves, lungs, other parts of body or loss of life  Patient voiced understanding and assent.)       Anesthesia Quick Evaluation

## 2023-11-08 NOTE — Anesthesia Postprocedure Evaluation (Signed)
Anesthesia Post Note  Patient: Linda Silva  Procedure(s) Performed: COLONOSCOPY WITH PROPOFOL POLYPECTOMY  Patient location during evaluation: Endoscopy Anesthesia Type: General Level of consciousness: awake and alert Pain management: pain level controlled Vital Signs Assessment: post-procedure vital signs reviewed and stable Respiratory status: spontaneous breathing, nonlabored ventilation, respiratory function stable and patient connected to nasal cannula oxygen Cardiovascular status: blood pressure returned to baseline and stable Postop Assessment: no apparent nausea or vomiting Anesthetic complications: no   There were no known notable events for this encounter.   Last Vitals:  Vitals:   11/08/23 0848 11/08/23 0930  BP: (!) 146/87 109/77  Pulse: 86 83  Resp: 16 18  Temp: (!) 36.2 C 36.4 C  SpO2: 99% 100%    Last Pain:  Vitals:   11/08/23 0930  TempSrc:   PainSc: 0-No pain                 Louie Boston

## 2023-11-08 NOTE — Transfer of Care (Signed)
Immediate Anesthesia Transfer of Care Note  Patient: Linda Silva  Procedure(s) Performed: COLONOSCOPY WITH PROPOFOL POLYPECTOMY  Patient Location: PACU  Anesthesia Type:General  Level of Consciousness: awake and patient cooperative  Airway & Oxygen Therapy: Patient Spontanous Breathing  Post-op Assessment: Report given to RN and Post -op Vital signs reviewed and stable  Post vital signs: stable  Last Vitals:  Vitals Value Taken Time  BP 109/77 11/08/23 0931  Temp    Pulse 83 11/08/23 0931  Resp 18 11/08/23 0931  SpO2 100 % 11/08/23 0931  Vitals shown include unfiled device data.  Last Pain:  Vitals:   11/08/23 0848  TempSrc: Temporal  PainSc: 0-No pain         Complications: No notable events documented.

## 2023-11-11 ENCOUNTER — Encounter: Payer: Self-pay | Admitting: Gastroenterology

## 2023-11-11 LAB — SURGICAL PATHOLOGY

## 2023-11-15 ENCOUNTER — Other Ambulatory Visit: Payer: Self-pay | Admitting: Internal Medicine

## 2023-11-15 DIAGNOSIS — Z1231 Encounter for screening mammogram for malignant neoplasm of breast: Secondary | ICD-10-CM

## 2023-11-15 DIAGNOSIS — E782 Mixed hyperlipidemia: Secondary | ICD-10-CM

## 2023-11-15 DIAGNOSIS — R300 Dysuria: Secondary | ICD-10-CM

## 2023-11-15 DIAGNOSIS — Z0000 Encounter for general adult medical examination without abnormal findings: Secondary | ICD-10-CM

## 2024-05-27 ENCOUNTER — Other Ambulatory Visit: Payer: Self-pay | Admitting: Internal Medicine

## 2024-05-27 DIAGNOSIS — Z1231 Encounter for screening mammogram for malignant neoplasm of breast: Secondary | ICD-10-CM

## 2024-05-27 DIAGNOSIS — Z0000 Encounter for general adult medical examination without abnormal findings: Secondary | ICD-10-CM

## 2024-05-27 DIAGNOSIS — R300 Dysuria: Secondary | ICD-10-CM

## 2024-05-27 DIAGNOSIS — E782 Mixed hyperlipidemia: Secondary | ICD-10-CM

## 2024-07-24 ENCOUNTER — Other Ambulatory Visit: Payer: Self-pay | Admitting: Anesthesiology

## 2024-07-25 LAB — COMPREHENSIVE METABOLIC PANEL WITH GFR
ALT: 24 IU/L (ref 0–32)
AST: 22 IU/L (ref 0–40)
Albumin: 4.4 g/dL (ref 3.8–4.9)
Alkaline Phosphatase: 59 IU/L (ref 44–121)
BUN/Creatinine Ratio: 14 (ref 12–28)
BUN: 10 mg/dL (ref 8–27)
Bilirubin Total: 0.7 mg/dL (ref 0.0–1.2)
CO2: 20 mmol/L (ref 20–29)
Calcium: 9.3 mg/dL (ref 8.7–10.3)
Chloride: 104 mmol/L (ref 96–106)
Creatinine, Ser: 0.74 mg/dL (ref 0.57–1.00)
Globulin, Total: 2.6 g/dL (ref 1.5–4.5)
Glucose: 94 mg/dL (ref 70–99)
Potassium: 4.1 mmol/L (ref 3.5–5.2)
Sodium: 139 mmol/L (ref 134–144)
Total Protein: 7 g/dL (ref 6.0–8.5)
eGFR: 93 mL/min/1.73 (ref >59)

## 2024-07-25 LAB — T4, FREE: Free T4: 0.9 ng/dL (ref 0.82–1.77)

## 2024-07-25 LAB — LIPID PANEL WITH LDL/HDL RATIO
Cholesterol, Total: 191 mg/dL (ref 100–199)
HDL: 47 mg/dL (ref >39)
LDL Chol Calc (NIH): 97 mg/dL (ref 0–99)
LDL/HDL Ratio: 2.1 ratio (ref 0.0–3.2)
Triglycerides: 277 mg/dL — ABNORMAL HIGH (ref 0–149)
VLDL Cholesterol Cal: 47 mg/dL — ABNORMAL HIGH (ref 5–40)

## 2024-07-25 LAB — TSH: TSH: 2.5 u[IU]/mL (ref 0.450–4.500)

## 2024-07-25 LAB — VITAMIN D 25 HYDROXY (VIT D DEFICIENCY, FRACTURES): Vit D, 25-Hydroxy: 25.7 ng/mL — ABNORMAL LOW (ref 30.0–100.0)

## 2024-07-28 ENCOUNTER — Encounter: Payer: Self-pay | Admitting: Internal Medicine

## 2024-07-28 ENCOUNTER — Ambulatory Visit (INDEPENDENT_AMBULATORY_CARE_PROVIDER_SITE_OTHER): Payer: Managed Care, Other (non HMO) | Admitting: Internal Medicine

## 2024-07-28 VITALS — BP 110/80 | HR 89 | Temp 98.0°F | Resp 16 | Ht 62.0 in | Wt 148.0 lb

## 2024-07-28 DIAGNOSIS — Z0001 Encounter for general adult medical examination with abnormal findings: Secondary | ICD-10-CM

## 2024-07-28 DIAGNOSIS — E782 Mixed hyperlipidemia: Secondary | ICD-10-CM | POA: Diagnosis not present

## 2024-07-28 DIAGNOSIS — E559 Vitamin D deficiency, unspecified: Secondary | ICD-10-CM | POA: Diagnosis not present

## 2024-07-28 DIAGNOSIS — Z1231 Encounter for screening mammogram for malignant neoplasm of breast: Secondary | ICD-10-CM | POA: Diagnosis not present

## 2024-07-28 DIAGNOSIS — R300 Dysuria: Secondary | ICD-10-CM

## 2024-07-28 MED ORDER — ROSUVASTATIN CALCIUM 5 MG PO TABS: 5.0000 mg | ORAL_TABLET | Freq: Every day | ORAL | 3 refills | Status: AC

## 2024-07-28 NOTE — Progress Notes (Signed)
 [facility] [address] [place], [place] [address]  Internal MEDICINE  Office Visit Note  Patient Name: Linda Silva  [id number]  [id number]  Date of Service: 07/28/2024  Chief Complaint  Patient presents with   Annual Exam   Hyperlipidemia   Medication Refill    Crestor      HPI Pt is here for routine health maintenance examination She had her colonoscopy last year, eye exam is up to date as well  Takes Crestor  on a regular basis  Has active life style   Current Medication: Outpatient Encounter Medications as of 07/28/2024  Medication Sig   [DISCONTINUED] rosuvastatin  (CRESTOR ) 5 MG tablet TAKE 1 TABLET(5 MG) BY MOUTH DAILY   rosuvastatin  (CRESTOR ) 5 MG tablet Take 1 tablet (5 mg total) by mouth daily.   No facility-administered encounter medications on file as of 07/28/2024.    Surgical History: Past Surgical History:  Procedure Laterality Date   COLONOSCOPY WITH PROPOFOL  N/A 11/08/2023   Procedure: COLONOSCOPY WITH PROPOFOL ;  Surgeon: Unk Corinn Skiff, MD;  Location: [facility] END[facility]COPY;  Service: Gastroenterology;  Laterality: N/A;   NECK SURGERY     POLYPECTOMY  11/08/2023   Procedure: POLYPECTOMY;  Surgeon: Unk Corinn Skiff, MD;  Location: [facility] ENDOSCOPY;  Service: Gastroenterology;;    Medical History: Past Medical History:  Diagnosis Date   Hyperlipidemia    Vitamin D  deficiency     Family History: Family History  Problem Relation Age of Onset   Breast cancer Neg Hx     Social History: Social History   Socioeconomic History   Marital status: Married    Spouse name: Not on file   Number of children: Not on file   Years of education: Not on file   Highest education level: Not on file  Occupational History   Not on file  Tobacco Use   Smoking status: Never   Smokeless tobacco: Never  Vaping Use   Vaping status: Never Used  Substance and Sexual Activity   Alcohol use: Never   Drug use: Never   Sexual activity: Not on file   Other Topics Concern   Not on file  Social History Narrative   Not on file   Social Drivers of Health   Financial Resource Strain: Not on file  Food Insecurity: Not on file  Transportation Needs: Not on file  Physical Activity: Not on file  Stress: Not on file  Social Connections: Not on file      Review of Systems  Constitutional:  Negative for chills, fatigue and unexpected weight change.  HENT:  Positive for postnasal drip. Negative for congestion, rhinorrhea, sneezing and sore throat.   Eyes:  Negative for redness.  Respiratory:  Negative for cough, chest tightness and shortness of breath.   Cardiovascular:  Negative for chest pain and palpitations.  Gastrointestinal:  Negative for abdominal pain, constipation, diarrhea, nausea and vomiting.  Genitourinary:  Negative for dysuria and frequency.  Musculoskeletal:  Negative for arthralgias, back pain, joint swelling and neck pain.  Skin:  Negative for rash.  Neurological: Negative.  Negative for tremors and numbness.  Hematological:  Negative for adenopathy. Does not bruise/bleed easily.  Psychiatric/Behavioral:  Negative for behavioral problems (Depression), sleep disturbance and suicidal ideas. The patient is not nervous/anxious.      Vital Signs: BP 110/80   Pulse 89   Temp 98 F (36.7 C)   Resp 16   Ht 5' 2 (1.575 m)   Wt 148 lb (67.1 kg)   SpO2  98%   BMI 27.07 kg/m    Physical Exam Constitutional:      General: She is not in acute distress.    Appearance: Normal appearance. She is well-developed. She is not diaphoretic.  HENT:     Head: Normocephalic and atraumatic.     Nose: Nose normal.     Mouth/Throat:     Mouth: Mucous membranes are moist.     Pharynx: No oropharyngeal exudate or posterior oropharyngeal erythema.  Eyes:     Extraocular Movements: Extraocular movements intact.     Pupils: Pupils are equal, round, and reactive to light.  Neck:     Thyroid: No thyromegaly.     Vascular: No JVD.      Trachea: No tracheal deviation.  Cardiovascular:     Rate and Rhythm: Normal rate and regular rhythm.     Pulses: Normal pulses.     Heart sounds: Normal heart sounds. No murmur heard.    No friction rub. No gallop.  Pulmonary:     Effort: Pulmonary effort is normal. No respiratory distress.     Breath sounds: Normal breath sounds. No wheezing or rales.  Chest:     Chest wall: No tenderness.  Breasts:    Right: Skin change present. No bleeding.     Left: Normal. No swelling or skin change.  Abdominal:     General: Bowel sounds are normal.     Palpations: Abdomen is soft.  Musculoskeletal:        General: Normal range of motion.     Cervical back: Normal range of motion and neck supple.  Lymphadenopathy:     Cervical: No cervical adenopathy.  Skin:    General: Skin is warm and dry.  Neurological:     General: No focal deficit present.     Mental Status: She is alert and oriented to person, place, and time.     Cranial Nerves: No cranial nerve deficit.  Psychiatric:        Mood and Affect: Mood normal.        Behavior: Behavior normal.        Thought Content: Thought content normal.        Judgment: Judgment normal.      LABS: Recent Results (from the past 2160 hours)  Comprehensive metabolic panel with GFR     Status: None   Collection Time: 07/24/24  7:09 AM  Result Value Ref Range   Glucose 94 70 - 99 mg/dL   BUN 10 8 - 27 mg/dL   Creatinine, Ser 9.25 0.57 - 1.00 mg/dL   eGFR 93 >40 fO/fpw/8.26   BUN/Creatinine Ratio 14 12 - 28   Sodium 139 134 - 144 mmol/L   Potassium 4.1 3.5 - 5.2 mmol/L   Chloride 104 96 - 106 mmol/L   CO2 20 20 - 29 mmol/L   Calcium  9.3 8.7 - 10.3 mg/dL   Total Protein 7.0 6.0 - 8.5 g/dL   Albumin 4.4 3.8 - 4.9 g/dL   Globulin, Total 2.6 1.5 - 4.5 g/dL   Bilirubin Total 0.7 0.0 - 1.2 mg/dL   Alkaline Phosphatase 59 44 - 121 IU/L    Comment: **Effective August 10, 2024 Alkaline Phosphatase**   reference interval will be changing  to:              Age                Female          Female  0 -  5 days         47 - 127       47 - 127           6 - 10 days         29 - 242       29 - 242          11 - 20 days        109 - 357      109 - 357          21 - 30 days         94 - 494       94 - 494           1 -  2 months      149 - 539      149 - 539           3 -  6 months      131 - 452      131 - 452           7 - 11 months      117 - 401      117 - 401   12 months -  6 years       158 - 369      158 - 369           7 - 12 years       150 - 409      150 - 409               13 years       156 - 435       78 - 227               14 years       114 - 375       64 - 161               15 years        88 - 279       56 - 134               16 years        74 - 207       51 - 121               17 years        63 - 161       47 - 113          18 - 20 years        51 - 125       42 - 106          21 - 50 years         47 - 123       41 - 116          51 - 80 years        49 - 135       51 - 125              >80 years        48 - 129       48 - 129    AST 22 0 - 40 IU/L   ALT 24 0 - 32 IU/L  Lipid Panel With LDL/HDL Ratio  Status: Abnormal   Collection Time: 07/24/24  7:09 AM  Result Value Ref Range   Cholesterol, Total 191 100 - 199 mg/dL   Triglycerides 722 (H) 0 - 149 mg/dL   HDL 47 >60 mg/dL   VLDL Cholesterol Cal 47 (H) 5 - 40 mg/dL   LDL Chol Calc (NIH) 97 0 - 99 mg/dL   LDL/HDL Ratio 2.1 0.0 - 3.2 ratio    Comment:                                     LDL/HDL Ratio                                             Men  Women                               1/2 Avg.Risk  1.0    1.5                                   Avg.Risk  3.6    3.2                                2X Avg.Risk  6.2    5.0                                3X Avg.Risk  8.0    6.1   T4, free     Status: None   Collection Time: 07/24/24  7:09 AM  Result Value Ref Range   Free T4 0.90 0.82 - 1.77 ng/dL  TSH     Status: None   Collection Time:  07/24/24  7:09 AM  Result Value Ref Range   TSH 2.500 0.450 - 4.500 uIU/mL  VITAMIN D  25 Hydroxy (Vit-D Deficiency, Fractures)     Status: Abnormal   Collection Time: 07/24/24  7:09 AM  Result Value Ref Range   Vit D, 25-Hydroxy 25.7 (L) 30.0 - 100.0 ng/mL    Comment: Vitamin D  deficiency has been defined by the Institute of Medicine and an Endocrine Society practice guideline as a level of serum 25-OH vitamin D  less than 20 ng/mL (1,2). The Endocrine Society went on to further define vitamin D  insufficiency as a level between 21 and 29 ng/mL (2). 1. IOM (Institute of Medicine). 2010. Dietary reference    intakes for calcium  and D. [place]: The    [employer]. 2. Holick MF, Binkley [place], Bischoff-Ferrari HA, et al.    Evaluation, treatment, and prevention of vitamin D     deficiency: an Endocrine Society clinical practice    guideline. JCEM. 2011 Jul; 96(7):1911-30.       Assessment/Plan: 1. Encounter for general adult medical examination with abnormal findings (Primary) All PHM is updated   2. Visit for screening mammogram - MM 3D SCREENING MAMMOGRAM BILATERAL BREAST; Future  3. Mixed hyperlipidemia TG are slightly worse but LDL at target, will continue Crestor   - rosuvastatin  (CRESTOR ) 5 MG tablet; Take 1 tablet (5 mg total) by mouth daily.  Dispense: 90 tablet; Refill: 3  4. Vitamin D  deficiency Continue OTC vitD d  5. Dysuria - UA/M w/rflx Culture, Routine   General Counseling: Randee verbalizes understanding of the findings of todays visit and agrees with plan of treatment. I have discussed any further diagnostic evaluation that may be needed or ordered today. We also reviewed her medications today. she has been encouraged to call the office with any questions or concerns that should arise related to todays visit.    Counseling:  [place] Controlled Substance Database was reviewed by me.  Orders Placed This Encounter  Procedures   MM 3D SCREENING  MAMMOGRAM BILATERAL BREAST   UA/M w/rflx Culture, Routine    Meds ordered this encounter  Medications   rosuvastatin  (CRESTOR ) 5 MG tablet    Sig: Take 1 tablet (5 mg total) by mouth daily.    Dispense:  90 tablet    Refill:  3    Total time spent:25 Minutes  Time spent includes review of chart, medications, test results, and follow up plan with the patient.     Sigrid CHRISTELLA Bathe, MD  Internal Medicine

## 2024-07-29 LAB — UA/M W/RFLX CULTURE, ROUTINE
Bilirubin, UA: NEGATIVE
Glucose, UA: NEGATIVE
Ketones, UA: NEGATIVE
Leukocytes,UA: NEGATIVE
Nitrite, UA: NEGATIVE
Protein,UA: NEGATIVE
RBC, UA: NEGATIVE
Specific Gravity, UA: <=1.005 — AB (ref 1.005–1.030)
Urobilinogen, Ur: 0.2 mg/dL (ref 0.2–1.0)
pH, UA: 6.5 (ref 5.0–7.5)

## 2024-07-29 LAB — MICROSCOPIC EXAMINATION
Bacteria, UA: NONE SEEN
Casts: NONE SEEN /LPF
Epithelial Cells (non renal): NONE SEEN /HPF (ref 0–10)
RBC, Urine: NONE SEEN /HPF (ref 0–2)
WBC, UA: NONE SEEN /HPF (ref 0–5)

## 2024-09-17 ENCOUNTER — Ambulatory Visit
Admission: RE | Admit: 2024-09-17 | Discharge: 2024-09-17 | Disposition: A | Discharge status: Home / Self Care | Source: Ambulatory Visit | Attending: Internal Medicine | Admitting: Internal Medicine

## 2024-09-17 DIAGNOSIS — Z1231 Encounter for screening mammogram for malignant neoplasm of breast: Secondary | ICD-10-CM | POA: Insufficient documentation

## 2024-09-21 ENCOUNTER — Ambulatory Visit (INDEPENDENT_AMBULATORY_CARE_PROVIDER_SITE_OTHER): Admitting: Physician Assistant

## 2024-09-21 VITALS — BP 138/88 | Temp 97.8°F | Resp 16 | Ht 62.0 in | Wt 146.0 lb

## 2024-09-21 DIAGNOSIS — R42 Dizziness and giddiness: Secondary | ICD-10-CM

## 2024-09-21 DIAGNOSIS — R519 Headache, unspecified: Secondary | ICD-10-CM

## 2024-09-21 DIAGNOSIS — F411 Generalized anxiety disorder: Secondary | ICD-10-CM | POA: Diagnosis not present

## 2024-09-21 DIAGNOSIS — F321 Major depressive disorder, single episode, moderate: Secondary | ICD-10-CM | POA: Diagnosis not present

## 2024-09-21 MED ORDER — ESCITALOPRAM OXALATE 5 MG PO TABS: 5.0000 mg | ORAL_TABLET | Freq: Every day | ORAL | 2 refills | Status: AC

## 2024-09-21 MED ORDER — MECLIZINE HCL 12.5 MG PO TABS: ORAL_TABLET | ORAL | 1 refills | Status: AC

## 2024-09-21 NOTE — Progress Notes (Signed)
 [facility] [address] [place], [place] [address]  Internal MEDICINE  Office Visit Note  Patient Name: Linda Silva  [id number]  [id number]  Date of Service: 09/21/2024  Chief Complaint  Patient presents with   Acute Visit    vertigo   Headache     HPI Pt is here for a sick visit. -states her husband left her for 1.5 months, then came back 4 weeks ago and feels a lot of stress and pressure. States she feels like she does everything for him. Has been feeling too much pressure from him. Denies any physical abuse and denies feeling unsafe in home. States she can't really talk to anyone about it as no family is here and is worried friends will start talking -she reports being open to speaking with psychologist at this time -eating well and exercising -having some headaches and vertigo past few days. Woke up sat night at 3:30am and moved head and started spinning. When she turned head again this happened again. Sunday morning still in bed and had it again. -did her shower and went to get grocery. Had husband drive to be safe. When she would bend head down would happen a little again but not as bad -last night laid down at 9:30 got dizzy again and vomited. Felt anxious after -no fever, but did feel a chill and sweats after vomiting. Otherwise has felt well, no sick contacts or sinus congestion -last night took a tab of meclizine that her husband had after vomiting and took tylenol PM and then was able to get some sleep but restless after 3:30-5:40 -feels well this morning, but has headache more in the top/back still. Headache all 3 days. -states headache has been variable, but at one point did feel it was 10/10, but then improved.It is better today 3-4/10. Thinks stress is causing this and feeling all the pressure from husband leaving and returning -no vision changes or weakness -Has BP cuff at home and will monitor. BP improved in office. -she mentions a difficult  blood draw on left side a few months ago and still a little tender at times. States it was bruised for awhile, but this resolved.  Current Medication:  Outpatient Encounter Medications as of 09/21/2024  Medication Sig   escitalopram (LEXAPRO) 5 MG tablet Take 1 tablet (5 mg total) by mouth daily.   meclizine (ANTIVERT) 12.5 MG tablet One tab po tid prn for dizziness   rosuvastatin  (CRESTOR ) 5 MG tablet Take 1 tablet (5 mg total) by mouth daily.   No facility-administered encounter medications on file as of 09/21/2024.      Medical History: Past Medical History:  Diagnosis Date   Hyperlipidemia    Vitamin D  deficiency      Vital Signs: BP 138/88 Comment: standing  Temp 97.8 F (36.6 C)   Resp 16   Ht 5' 2 (1.575 m)   Wt 146 lb (66.2 kg)   SpO2 99%   BMI 26.70 kg/m    Review of Systems  Constitutional:  Negative for fatigue and fever.  HENT:  Negative for congestion, mouth sores and postnasal drip.   Respiratory:  Negative for cough.   Cardiovascular:  Negative for chest pain.  Genitourinary:  Negative for flank pain.  Neurological:  Positive for dizziness and headaches. Negative for syncope and weakness.  Psychiatric/Behavioral:  Positive for behavioral problems and sleep disturbance. Negative for confusion. The patient is nervous/anxious.     Physical Exam Vitals and nursing note reviewed.  Constitutional:      Appearance: She is well-developed. She is not diaphoretic.  HENT:     Head: Normocephalic and atraumatic.  Eyes:     Extraocular Movements: Extraocular movements intact.     Pupils: Pupils are equal, round, and reactive to light.  Cardiovascular:     Rate and Rhythm: Normal rate and regular rhythm.  Pulmonary:     Effort: Pulmonary effort is normal.     Breath sounds: Normal breath sounds.  Skin:    General: Skin is warm and dry.  Neurological:     Mental Status: She is alert and oriented to person, place, and time.     Sensory: No sensory  deficit.     Motor: No weakness.     Coordination: Coordination normal.     Gait: Gait normal.  Psychiatric:        Mood and Affect: Mood is anxious and depressed.     Comments: Tearful in office speaking about pressure/stress       Assessment/Plan: 1. Vertigo (Primary) May take meclizine as needed, advised on changing position slowly and avoiding sudden head turns. May need ENT/vestibular therapy if not improving - meclizine (ANTIVERT) 12.5 MG tablet; One tab po tid prn for dizziness  Dispense: 30 tablet; Refill: 1  2. Acute nonintractable headache, unspecified headache type Fluctuating headache past few days since increased stress and poor sleep. Advised to go to ED if worsening or if any new symptoms arise. Pt expressed understanding.  3. GAD (generalized anxiety disorder) Will start lexapro daily and titrate as needed. Will refer to therapy - Ambulatory referral to Psychology - escitalopram (LEXAPRO) 5 MG tablet; Take 1 tablet (5 mg total) by mouth daily.  Dispense: 30 tablet; Refill: 2  4. Current moderate episode of major depressive disorder without prior episode (HCC) Will start lexapro daily and titrate as needed. Will refer to therapy - Ambulatory referral to Psychology - escitalopram (LEXAPRO) 5 MG tablet; Take 1 tablet (5 mg total) by mouth daily.  Dispense: 30 tablet; Refill: 2   General Counseling: Linda Silva verbalizes understanding of the findings of todays visit and agrees with plan of treatment. I have discussed any further diagnostic evaluation that may be needed or ordered today. We also reviewed her medications today. she has been encouraged to call the office with any questions or concerns that should arise related to todays visit.    Counseling:    Orders Placed This Encounter  Procedures   Ambulatory referral to Psychology    Meds ordered this encounter  Medications   escitalopram (LEXAPRO) 5 MG tablet    Sig: Take 1 tablet (5 mg total) by mouth  daily.    Dispense:  30 tablet    Refill:  2   meclizine (ANTIVERT) 12.5 MG tablet    Sig: One tab po tid prn for dizziness    Dispense:  30 tablet    Refill:  1    Time spent:30 Minutes

## 2024-09-22 ENCOUNTER — Telehealth: Payer: Self-pay | Admitting: Physician Assistant

## 2024-09-22 NOTE — Telephone Encounter (Signed)
 Psychology referral faxed to [facility]; [phone] Notified patient. Gave pt telephone [phone]

## 2024-10-05 ENCOUNTER — Ambulatory Visit: Admitting: Physician Assistant

## 2025-08-03 ENCOUNTER — Encounter: Admitting: Internal Medicine
